# Patient Record
Sex: Male | Born: 1940
Health system: Southern US, Community
[De-identification: ages and names within clinical notes are randomized; demographics above are authoritative.]

## PROBLEM LIST (undated history)

## (undated) DIAGNOSIS — I10 Essential (primary) hypertension: Secondary | ICD-10-CM

## (undated) DIAGNOSIS — E785 Hyperlipidemia, unspecified: Secondary | ICD-10-CM

## (undated) DIAGNOSIS — N400 Enlarged prostate without lower urinary tract symptoms: Secondary | ICD-10-CM

## (undated) HISTORY — DX: Hyperlipidemia, unspecified: E78.5

## (undated) HISTORY — DX: Benign prostatic hyperplasia without lower urinary tract symptoms: N40.0

## (undated) HISTORY — DX: Essential (primary) hypertension: I10

---

## 2001-08-05 ENCOUNTER — Encounter: Admission: RE | Admit: 2001-08-05 | Discharge: 2001-08-05 | Payer: Self-pay | Admitting: *Deleted

## 2011-09-26 ENCOUNTER — Observation Stay (HOSPITAL_COMMUNITY)
Admission: EM | Admit: 2011-09-26 | Discharge: 2011-09-27 | Disposition: A | Discharge status: Home / Self Care | Payer: Medicare HMO | Attending: Cardiology | Admitting: Cardiology

## 2011-09-26 ENCOUNTER — Emergency Department (HOSPITAL_COMMUNITY): Payer: Medicare HMO

## 2011-09-26 DIAGNOSIS — N401 Enlarged prostate with lower urinary tract symptoms: Secondary | ICD-10-CM | POA: Insufficient documentation

## 2011-09-26 DIAGNOSIS — I10 Essential (primary) hypertension: Secondary | ICD-10-CM

## 2011-09-26 DIAGNOSIS — N138 Other obstructive and reflux uropathy: Secondary | ICD-10-CM | POA: Insufficient documentation

## 2011-09-26 DIAGNOSIS — Z9119 Patient's noncompliance with other medical treatment and regimen: Secondary | ICD-10-CM | POA: Insufficient documentation

## 2011-09-26 DIAGNOSIS — R3129 Other microscopic hematuria: Secondary | ICD-10-CM | POA: Insufficient documentation

## 2011-09-26 DIAGNOSIS — I517 Cardiomegaly: Secondary | ICD-10-CM | POA: Insufficient documentation

## 2011-09-26 DIAGNOSIS — E7800 Pure hypercholesterolemia, unspecified: Secondary | ICD-10-CM | POA: Insufficient documentation

## 2011-09-26 DIAGNOSIS — R079 Chest pain, unspecified: Secondary | ICD-10-CM | POA: Insufficient documentation

## 2011-09-26 DIAGNOSIS — Z91199 Patient's noncompliance with other medical treatment and regimen due to unspecified reason: Secondary | ICD-10-CM | POA: Insufficient documentation

## 2011-09-26 LAB — BASIC METABOLIC PANEL
BUN: 14 mg/dL (ref 6–23)
CO2: 24 mEq/L (ref 19–32)
Calcium: 9.2 mg/dL (ref 8.4–10.5)
Chloride: 102 mEq/L (ref 96–112)
Creatinine, Ser: 1.01 mg/dL (ref 0.50–1.35)
GFR calc Af Amer: 86 mL/min — ABNORMAL LOW (ref >90)
GFR calc non Af Amer: 74 mL/min — ABNORMAL LOW (ref >90)
Glucose, Bld: 107 mg/dL — ABNORMAL HIGH (ref 70–99)
Potassium: 3.4 mEq/L — ABNORMAL LOW (ref 3.5–5.1)
Sodium: 138 mEq/L (ref 135–145)

## 2011-09-26 LAB — POCT I-STAT TROPONIN I: Troponin i, poc: 0.01 ng/mL (ref 0.00–0.08)

## 2011-09-26 LAB — URINE MICROSCOPIC-ADD ON

## 2011-09-26 LAB — CBC
HCT: 44 % (ref 39.0–52.0)
Hemoglobin: 15.4 g/dL (ref 13.0–17.0)
MCH: 31.9 pg (ref 26.0–34.0)
MCHC: 35 g/dL (ref 30.0–36.0)
MCV: 91.1 fL (ref 78.0–100.0)
Platelets: 193 10*3/uL (ref 150–400)
RBC: 4.83 MIL/uL (ref 4.22–5.81)
RDW: 12.3 % (ref 11.5–15.5)
WBC: 9.8 10*3/uL (ref 4.0–10.5)

## 2011-09-26 LAB — URINALYSIS, ROUTINE W REFLEX MICROSCOPIC
Glucose, UA: NEGATIVE mg/dL
Ketones, ur: NEGATIVE mg/dL
Leukocytes, UA: NEGATIVE
Nitrite: NEGATIVE
Protein, ur: 100 mg/dL — AB
pH: 7 (ref 5.0–8.0)

## 2011-09-26 LAB — COMPREHENSIVE METABOLIC PANEL
ALT: 20 U/L (ref 0–53)
AST: 20 U/L (ref 0–37)
CO2: 26 mEq/L (ref 19–32)
Calcium: 9.3 mg/dL (ref 8.4–10.5)
Chloride: 101 mEq/L (ref 96–112)
GFR calc Af Amer: 80 mL/min — ABNORMAL LOW (ref >90)
GFR calc non Af Amer: 69 mL/min — ABNORMAL LOW (ref >90)
Glucose, Bld: 171 mg/dL — ABNORMAL HIGH (ref 70–99)
Sodium: 138 mEq/L (ref 135–145)
Total Bilirubin: 0.6 mg/dL (ref 0.3–1.2)

## 2011-09-26 LAB — LIPID PANEL
HDL: 54 mg/dL (ref >39)
LDL Cholesterol: 162 mg/dL — ABNORMAL HIGH (ref 0–99)

## 2011-09-26 LAB — DIFFERENTIAL
Basophils Absolute: 0 10*3/uL (ref 0.0–0.1)
Basophils Relative: 0 % (ref 0–1)
Eosinophils Absolute: 0 10*3/uL (ref 0.0–0.7)
Eosinophils Relative: 0 % (ref 0–5)
Lymphocytes Relative: 17 % (ref 12–46)
Lymphs Abs: 1.6 10*3/uL (ref 0.7–4.0)
Monocytes Absolute: 0.5 10*3/uL (ref 0.1–1.0)
Monocytes Relative: 5 % (ref 3–12)
Neutro Abs: 7.6 10*3/uL (ref 1.7–7.7)
Neutrophils Relative %: 78 % — ABNORMAL HIGH (ref 43–77)

## 2011-09-26 LAB — CARDIAC PANEL(CRET KIN+CKTOT+MB+TROPI)
Relative Index: 3 — ABNORMAL HIGH (ref 0.0–2.5)
Total CK: 230 U/L (ref 7–232)

## 2011-09-27 DIAGNOSIS — I10 Essential (primary) hypertension: Secondary | ICD-10-CM

## 2011-09-27 LAB — CARDIAC PANEL(CRET KIN+CKTOT+MB+TROPI)
CK, MB: 5.6 ng/mL — ABNORMAL HIGH (ref 0.3–4.0)
Total CK: 212 U/L (ref 7–232)
Troponin I: <0.3 ng/mL (ref <0.30)

## 2011-09-27 LAB — HEMOGLOBIN A1C
Hgb A1c MFr Bld: 5.5 % (ref <5.7)
Mean Plasma Glucose: 111 mg/dL (ref <117)

## 2011-09-27 NOTE — H&P (Signed)
Marc [name], Marc [name] NO.:  [id number]  MEDICAL RECORD NO.:  [id number]  LOCATION:  2013                         FACILITY:  [facility]  PHYSICIAN:  Cassell Clement, M.D. DATE OF BIRTH:  08-28-1941  DATE OF ADMISSION:  09/26/2011 DATE OF DISCHARGE:                             HISTORY & PHYSICAL   CARDIOLOGIST:  None.  PRIMARY MD:  [facility].  CHIEF COMPLAINT:  Uncontrolled hypertension.  HISTORY:  This is a 70 year old gentleman who presents to the emergency room with uncontrolled hypertension.  Blood pressures have been in the range of 236/120.  The patient has a past history of essential hypertension.  He had previously been on lisinopril, but had stopped taking his lisinopril/ HCT several months ago for unknown reasons.  The patient complained to his family this morning of being tired and dizzy. He also had a slight chest discomfort, grade 2/10.  He denied any shortness of breath or palpitations, nausea, sweating, or headache.  He has a past history of hypercholesterolemia.  There is a prescription in his old records for Pravachol, but it is unclear whether the patient ever took it.  The patient has been having some difficulty with urinary retention.  He has difficulty urinating.  The patient has had no prior cardiac workup.  He has had no cardiac catheterizations.  It is unknown whether he has ever had an echocardiogram.  He is on no medications at the present time.  He has no known drug allergies.  SOCIAL HISTORY:  He lives in [place] with his wife, son, and other family members.  FAMILY HISTORY:  Reveals that both parents are deceased of unknown cause.  There is no cardiac history in the family.  The patient does not smoke cigarettes.  He drinks an occasional beer.  REVIEW OF SYSTEMS:  Negative for any fever or chills.  He has had dizziness.  He has had no rash.  He denies headache.  He has had weakness.  He has had no  gastrointestinal problems.  He has had some difficulty with urinary emptying of his bladder.  All other systems reviewed and are negative.  PHYSICAL EXAMINATION:  VITAL SIGNS:  Blood pressure is as high as 236/120, now down to 176/90, after IV hydralazine, pulse is 93 sinus rhythm, respirations are 15, O2 sat 99%, temperature 97.6. GENERAL:  This is a well-developed, elderly gentleman, sitting comfortably in bed. HEAD AND NECK:  Unremarkable.  He does have arcus senilis.  The jugular venous pressure is normal. CHEST:  Clear to percussion and auscultation.  HEART:  Reveals a soft apical systolic murmur.  There is no gallop or rub. ABDOMEN:  Soft and nontender.  There are no bruits audible. EXTREMITIES:  Showed no phlebitis or edema.  NEUROLOGIC:  Intact. Integument is normal.  Chest x-ray shows mild cardiomegaly with low lung volumes without acute disease.  There are some small radiopaque metal objects projecting over the right side of the chest, which appear to be subcutaneous.  EKG shows normal sinus rhythm, mild nonspecific T-wave flattening laterally, but no acute changes.  LABORATORY STUDIES:  His initial troponin is normal.  The white count is 9800, hemoglobin 15.4,  hematocrit 44, sodium 138, potassium 3.4, BUN 14, creatinine 1.01.  IMPRESSION: 1. Uncontrolled hypertension secondary to noncompliance with     medication and no blood pressure medicines for the past several     months. 2. Mild chest discomfort graded 2/10, and also mild dizziness     secondary to hypertension. 3. Mild cardiomegaly. 4. Past history of elevated cholesterol. 5. Disposition.  We are admitting under observation status for blood     pressure control and to rule out an myocardial infarction.  I doubt     an myocardial infarction.  We will resume his lisinopril/HCT.  We     will add a low-dose beta-blocker.  We will get serial enzymes and     repeat his EKG in the morning.  We will use repeated  doses of     hydralazine intravenously tonight if necessary for marked     hypertension.          ______________________________ Cassell Clement, M.D.     TB/MEDQ  D:  09/26/2011  T:  09/27/2011  Job:  [id number]  cc:   [facility]  Electronically Signed by Cassell Clement M.D. on 09/27/2011 09:25:01 PM

## 2011-09-28 ENCOUNTER — Other Ambulatory Visit (HOSPITAL_COMMUNITY): Payer: Self-pay | Admitting: Cardiology

## 2011-09-28 DIAGNOSIS — R011 Cardiac murmur, unspecified: Secondary | ICD-10-CM

## 2011-09-28 LAB — URINE CULTURE

## 2011-09-29 ENCOUNTER — Other Ambulatory Visit: Payer: Self-pay | Admitting: Family Medicine

## 2011-09-29 ENCOUNTER — Ambulatory Visit (HOSPITAL_COMMUNITY)
Admission: RE | Admit: 2011-09-29 | Discharge: 2011-09-29 | Disposition: A | Discharge status: Home / Self Care | Payer: Medicare HMO | Source: Ambulatory Visit | Attending: Family Medicine | Admitting: Family Medicine

## 2011-09-29 DIAGNOSIS — I10 Essential (primary) hypertension: Secondary | ICD-10-CM | POA: Insufficient documentation

## 2011-09-29 DIAGNOSIS — G319 Degenerative disease of nervous system, unspecified: Secondary | ICD-10-CM | POA: Insufficient documentation

## 2011-09-29 DIAGNOSIS — R413 Other amnesia: Secondary | ICD-10-CM | POA: Insufficient documentation

## 2011-09-29 DIAGNOSIS — G939 Disorder of brain, unspecified: Secondary | ICD-10-CM | POA: Insufficient documentation

## 2011-09-29 NOTE — Discharge Summary (Addendum)
NAMEFLOY, Marc [name]                 ACCOUNT NO.:  [id number]  MEDICAL RECORD NO.:  [id number]  LOCATION:  2013                         FACILITY:  [facility]  PHYSICIAN:  Cassell Clement, M.D. DATE OF BIRTH:  1940/12/04  DATE OF ADMISSION:  09/26/2011 DATE OF DISCHARGE:  09/27/2011                              DISCHARGE SUMMARY   DISCHARGE DIAGNOSES: 1. Essential hypertension, uncontrolled. 2. Hypercholesterolemia, initiated on Lipitor this admission. 3. Benign prostatic hypertrophy with microscopic hematuria.  HOSPITAL COURSE:  Marc [name] is a 70 year old gentleman with a history of hypertension, who presented initially to [facility] with complaints of being tired and dizzy.  Blood pressure was found to be markedly elevated.  She was sent over to the ER for further evaluation.  Blood pressure was in the range of 236/120.  The patient previously been on lisinopril, but stopped for unknown reason.  He also has slight chest discomfort grade 2/10.  EKG demonstrated normal sinus rhythm with mild nonspecific T-wave flattening laterally, but no acute changes.  Cardiac enzymes were checked, which did show a mild elevation in his MB up to 6.8, but negative troponins x2.  The patient was re- initiated on a medication regimen that included low-dose beta-blocker, lisinopril, HCTZ with improvement in his blood pressure down to 140 range.  His potassium was also repleted.  Given an abnormal lipid panel including an LDL 162, and statin was initiated as well.  Chest x-ray demonstrated mild cardiomegaly and therefore 2D echocardiogram is favored to be done as an outpatient.  Dr. Patty Sermons was seen and examined him today and feels he is stable for discharge.  Of note, he also was found to have symptoms of BPH with moderate hematuria, and therefore be instructed to follow up back with his primary care provider.  DISCHARGE LABS:  WBC 9.8, hemoglobin 15.4, hematocrit 44, platelet  count 193.  Sodium 138, potassium 3.4, chloride 101, CO2 26, glucose 171, BUN 14, creatinine 1.07.  LFTs were normal.  A1c was 5.5.  CKs were negative.  CPK-MB 6.8.  Troponin negative x2.  Total cholesterol 243. Triglycerides 134, HDL 54, LDL 162.  UA showed moderate blood, with 100 protein, otherwise negative.  STUDIES: 1. Chest x-ray September 26, 2011 showed mild cardiomegaly and low lung     volumes without acute disease.  Small radiopaque metallic objects     project over the right side of the chest favored to be     subcutaneously.  He has known shrapnel from prior war injuries.  DISCHARGE MEDICATIONS: 1. Aspirin 81 mg daily, initiated per discussion with Dr. Patty Sermons. 2. HCTZ 25 mg daily. 3. Lipitor 20 mg at bedtime. 4. Lisinopril 20 mg daily. 5. Lopressor 25 mg half tablet b.i.d. 6. Potassium chloride 20 mEq daily.  DISPOSITION:  Marc [name] will be discharged in stable condition to home. He is to follow up with [facility] regards to his microscopic hematuria and also for continued medical maintenance.  He will have a 2D echocardiogram in our office on November 6 along with a BMET, on that day, he will also see Dr. Norma Fredrickson.  He is to arrive at our  office at 11:30 a.m. that day.  DURATION OF DISCHARGE ENCOUNTER:  Greater than 30 minutes including physician and PA time.     Ronie Spies, P.A.C.   ______________________________ Cassell Clement, M.D.    DD/MEDQ  D:  09/27/2011  T:  09/27/2011  Job:  [id number]  Electronically Signed by Ronie Spies  on 09/29/2011 12:03:31 PM Electronically Signed by Cassell Clement M.D. on 09/29/2011 10:18:22 PM

## 2011-10-01 ENCOUNTER — Encounter: Payer: Self-pay | Admitting: Nurse Practitioner

## 2011-10-02 ENCOUNTER — Ambulatory Visit (HOSPITAL_COMMUNITY): Payer: Medicare HMO | Attending: Cardiology

## 2011-10-02 ENCOUNTER — Ambulatory Visit (INDEPENDENT_AMBULATORY_CARE_PROVIDER_SITE_OTHER): Payer: Medicare HMO | Admitting: Nurse Practitioner

## 2011-10-02 ENCOUNTER — Ambulatory Visit (INDEPENDENT_AMBULATORY_CARE_PROVIDER_SITE_OTHER): Payer: Medicare HMO | Admitting: *Deleted

## 2011-10-02 ENCOUNTER — Encounter: Payer: Self-pay | Admitting: Nurse Practitioner

## 2011-10-02 VITALS — BP 186/102 | HR 70 | Ht 65.0 in | Wt 150.0 lb

## 2011-10-02 DIAGNOSIS — R319 Hematuria, unspecified: Secondary | ICD-10-CM | POA: Insufficient documentation

## 2011-10-02 DIAGNOSIS — I10 Essential (primary) hypertension: Secondary | ICD-10-CM | POA: Insufficient documentation

## 2011-10-02 DIAGNOSIS — I059 Rheumatic mitral valve disease, unspecified: Secondary | ICD-10-CM | POA: Insufficient documentation

## 2011-10-02 DIAGNOSIS — R011 Cardiac murmur, unspecified: Secondary | ICD-10-CM

## 2011-10-02 DIAGNOSIS — E785 Hyperlipidemia, unspecified: Secondary | ICD-10-CM

## 2011-10-02 DIAGNOSIS — I079 Rheumatic tricuspid valve disease, unspecified: Secondary | ICD-10-CM | POA: Insufficient documentation

## 2011-10-02 DIAGNOSIS — I379 Nonrheumatic pulmonary valve disorder, unspecified: Secondary | ICD-10-CM | POA: Insufficient documentation

## 2011-10-02 LAB — BASIC METABOLIC PANEL
CO2: 29 mEq/L (ref 19–32)
Chloride: 102 mEq/L (ref 96–112)
Creatinine, Ser: 1.1 mg/dL (ref 0.4–1.5)
Potassium: 3.8 mEq/L (ref 3.5–5.1)
Sodium: 138 mEq/L (ref 135–145)

## 2011-10-02 MED ORDER — CLONIDINE HCL 0.1 MG PO TABS: 0.1000 mg | ORAL_TABLET | Freq: Every day | ORAL | Status: DC

## 2011-10-02 NOTE — Patient Instructions (Addendum)
Blood pressure is still too high.  Continue with the other medicines. We are adding Clonidine 0.1 mg every day.   We will see you back in one week.   See your primary care doctor if you still have blood in your urine.  We will be calling with the results of your ultrasound in a few days.

## 2011-10-02 NOTE — Progress Notes (Signed)
    Marc [name] Date of Birth: 18-Jun-1941 Medical Record #[id number]  History of Present Illness: [name] is seen back today for a post hospital visit. He is seen for Dr. Patty [name]. He was admitted last week with hypertensive crisis. He was discharged on medicines. He comes in to the office today. He has a family member who says he is a son who interprets. He has no complaint. Denies chest pain or headache. Not dizzy. Blood pressure remains grossly elevated. The family member made a phone call to another son who says he has been taking his medicines.   Current Outpatient Prescriptions on File Prior to Visit  Medication Sig Dispense Refill  . aspirin 81 MG tablet Take 81 mg by mouth daily.        Marland Kitchen atorvastatin (LIPITOR) 20 MG tablet Take 20 mg by mouth daily.        . hydrochlorothiazide (HYDRODIURIL) 25 MG tablet Take 25 mg by mouth daily.        Marland Kitchen lisinopril (PRINIVIL,ZESTRIL) 20 MG tablet Take 20 mg by mouth daily.        . metoprolol tartrate (LOPRESSOR) 25 MG tablet Take 12.5 mg by mouth 2 (two) times daily.        . potassium chloride SA (K-DUR,KLOR-CON) 20 MEQ tablet Take 20 mEq by mouth daily.          No Known Allergies  Past Medical History  Diagnosis Date  . Hypertension   . Hyperlipidemia   . BPH (benign prostatic hypertrophy)     WITH MICROSCOPIC HEMATURIA    No past surgical history on file.  History  Smoking status  . Never Smoker   Smokeless tobacco  . Not on file    History  Alcohol Use  . Yes    OCCASSIONAL    No family history on file.  Review of Systems: The review of systems is per the HPI.  All other systems were reviewed and are negative.  Physical Exam: BP 186/102  Pulse 70  Ht 5\' 5"  (1.651 m)  Wt 150 lb (68.04 kg)  BMI 24.96 kg/m2 Repeat blood pressure is 200/100 by me.  Patient is in no acute distress. Does not speak [place]. Skin is warm and dry. Color is normal.  HEENT is unremarkable. Normocephalic/atraumatic. PERRL. Sclera are  nonicteric. Neck is supple. No masses. No JVD. Lungs are clear. Cardiac exam shows a regular rate and rhythm. No S4 noted. Abdomen is soft. Extremities are without edema. Gait and ROM are intact. No gross neurologic deficits noted.  LABORATORY DATA:   Assessment / Plan:

## 2011-10-02 NOTE — Assessment & Plan Note (Signed)
He is on statin therapy. Will need follow up labs in the future.

## 2011-10-02 NOTE — Assessment & Plan Note (Signed)
He was to follow up with his PCP.

## 2011-10-02 NOTE — Assessment & Plan Note (Addendum)
Blood pressure remains grossly elevated. I am not convinced of medication compliance. I have given him 0.1mg  of Clonidine po here in the office after talking with Dr. Patty Sermons. Will need to get a BMET today as well. Echo has been done earlier today. Those results are pending. We will tentatively see him back in 1 week. Patient is agreeable to this plan and will call if any problems develop in the interim.   Blood pressure has come down here in the office after a dose of Clonidine. He is down to 160/90. I will see him again in one week. We will continue the clonidine at 0.1mg  daily. Prescription was eprescribed.

## 2011-10-05 ENCOUNTER — Telehealth: Payer: Self-pay | Admitting: *Deleted

## 2011-10-05 NOTE — Telephone Encounter (Signed)
Advised son of medication change

## 2011-10-05 NOTE — Telephone Encounter (Signed)
Message copied by Burnell Blanks on Fri Oct 05, 2011  5:25 PM ------      Message from: Cassell Clement      Created: Tue Oct 02, 2011  9:05 PM       Please report.  Echo shows severe LVH with dynamic obstruction. Increase Metoprolol to 25 mg BID

## 2011-10-05 NOTE — Telephone Encounter (Signed)
Message copied by Burnell Blanks on Fri Oct 05, 2011  5:24 PM ------      Message from: Cassell Clement      Created: Tue Oct 02, 2011  4:42 PM       Please report.  The labs are stable.  Continue same meds.  Continue careful diet.

## 2011-10-05 NOTE — Telephone Encounter (Signed)
Advised son

## 2011-10-09 ENCOUNTER — Ambulatory Visit (INDEPENDENT_AMBULATORY_CARE_PROVIDER_SITE_OTHER): Payer: Medicare HMO | Admitting: Nurse Practitioner

## 2011-10-09 ENCOUNTER — Encounter: Payer: Self-pay | Admitting: Nurse Practitioner

## 2011-10-09 VITALS — BP 140/70 | HR 66 | Ht 63.0 in | Wt 150.0 lb

## 2011-10-09 DIAGNOSIS — R319 Hematuria, unspecified: Secondary | ICD-10-CM

## 2011-10-09 DIAGNOSIS — I10 Essential (primary) hypertension: Secondary | ICD-10-CM

## 2011-10-09 NOTE — Assessment & Plan Note (Signed)
Blood pressure recheck by me is better. He has good control at home reported. We will leave him on his current regimen. We will see him back in 3 months. His son will continue to monitor at home. Patient is agreeable to this plan and will call if any problems develop in the interim.

## 2011-10-09 NOTE — Assessment & Plan Note (Signed)
He will be seeing [facility] later this month.

## 2011-10-09 NOTE — Patient Instructions (Signed)
Continue with your current medicines. Monitor your blood pressure at home. Try to write some of the readings down.  Record your readings and bring to your next visit. Limit sodium intake.   We will see you back in about 3 months.  Call the [facility] office at [phone] if you have any questions, problems or concerns.

## 2011-10-09 NOTE — Progress Notes (Signed)
    Marc [name] Date of Birth: [birth year] Medical Record #[id number]  History of Present Illness: Marc [name] is seen back today for a one week check. He is seen for Dr. Patty Sermons. He is here with his son today who is the interpreter. He also manages his father's medicines.  His echo showed severe LVH. Metoprolol has been increased. His son has been checking his blood pressure at home. Readings are down to the 130 to 135 range at home. He is tolerating his medicines. He will be seeing GU later this month for his prostate and hematuria. Son reports some confusion but that is improving with time as well. No chest pain or shortness of breath. No dizziness or lightheadedness.   Current Outpatient Prescriptions on File Prior to Visit  Medication Sig Dispense Refill  . aspirin 81 MG tablet Take 81 mg by mouth daily.        Marland Kitchen atorvastatin (LIPITOR) 20 MG tablet Take 20 mg by mouth daily.        . cloNIDine (CATAPRES) 0.1 MG tablet Take 1 tablet (0.1 mg total) by mouth daily.  30 tablet  11  . hydrochlorothiazide (HYDRODIURIL) 25 MG tablet Take 25 mg by mouth daily.        Marland Kitchen lisinopril (PRINIVIL,ZESTRIL) 20 MG tablet Take 20 mg by mouth daily.        . metoprolol tartrate (LOPRESSOR) 25 MG tablet Take 25 mg by mouth 2 (two) times daily.       . potassium chloride SA (K-DUR,KLOR-CON) 20 MEQ tablet Take 20 mEq by mouth daily.          No Known Allergies  Past Medical History  Diagnosis Date  . Hypertension     has severe LVH per echo in November 2012  . Hyperlipidemia   . BPH (benign prostatic hypertrophy)     WITH MICROSCOPIC HEMATURIA  . Hematuria     History reviewed. No pertinent past surgical history.  History  Smoking status  . Never Smoker   Smokeless tobacco  . Not on file    History  Alcohol Use  . Yes    OCCASSIONAL    History reviewed. No pertinent family history.  Review of Systems: The review of systems is per the HPI.  All other systems were reviewed and are  negative.  Physical Exam: BP 140/70  Pulse 66  Ht 5\' 3"  (1.6 m)  Wt 150 lb (68.04 kg)  BMI 26.57 kg/m2 Patient is very pleasant and in no acute distress. Skin is warm and dry. Color is normal.  HEENT is unremarkable. Normocephalic/atraumatic. PERRL. Sclera are nonicteric. Neck is supple. No masses. No JVD. Lungs are clear. Cardiac exam shows a regular rate and rhythm. Abdomen is soft. Extremities are without edema. Gait and ROM are intact. No gross neurologic deficits noted.  LABORATORY DATA:   Assessment / Plan:

## 2011-10-24 ENCOUNTER — Other Ambulatory Visit: Payer: Self-pay | Admitting: Nurse Practitioner

## 2011-10-24 NOTE — Telephone Encounter (Signed)
New Msg: Pt calling stating that he went to pick, up RX for metoprolol and pharmacy said it was too early. Pt said RX stated that pt should take one and one half tablet per day however pt was told to take two tablets per day so pt now only has seven tablets left. Please return pt call to discuss further/clarify.

## 2011-10-25 MED ORDER — METOPROLOL TARTRATE 25 MG PO TABS: 25.0000 mg | ORAL_TABLET | Freq: Two times a day (BID) | ORAL | Status: DC

## 2011-10-25 NOTE — Telephone Encounter (Signed)
Pt's son aware RX for one tablet twice a day #60 with 6 refills called sent into pharmacy.  He had no further needs.

## 2011-10-25 NOTE — Telephone Encounter (Signed)
Pt is to take one tablet twice a day

## 2011-11-22 ENCOUNTER — Other Ambulatory Visit: Payer: Self-pay | Admitting: Internal Medicine

## 2012-01-14 ENCOUNTER — Ambulatory Visit (INDEPENDENT_AMBULATORY_CARE_PROVIDER_SITE_OTHER): Payer: Medicare Other | Admitting: Cardiology

## 2012-01-14 ENCOUNTER — Encounter: Payer: Self-pay | Admitting: Cardiology

## 2012-01-14 VITALS — BP 128/78 | HR 80 | Ht 65.0 in | Wt 161.0 lb

## 2012-01-14 DIAGNOSIS — E7800 Pure hypercholesterolemia, unspecified: Secondary | ICD-10-CM

## 2012-01-14 DIAGNOSIS — R319 Hematuria, unspecified: Secondary | ICD-10-CM

## 2012-01-14 DIAGNOSIS — I119 Hypertensive heart disease without heart failure: Secondary | ICD-10-CM

## 2012-01-14 DIAGNOSIS — I10 Essential (primary) hypertension: Secondary | ICD-10-CM

## 2012-01-14 NOTE — Assessment & Plan Note (Signed)
Blood pressure has been remaining normal at home on current therapy.

## 2012-01-14 NOTE — Assessment & Plan Note (Signed)
The patient has had no further hematuria.  He remains on Rapaflo with good results

## 2012-01-14 NOTE — Progress Notes (Signed)
Marc [name] Date of Birth:  08/18/1941 [facility] [address] [place], [place]  [address] [phone]  Fax   [phone]  HPI:  this pleasant 71 year old gentleman is seen for a three-month followup office visit.  He has a past history of severe hypertension with concentric LVH.  He has regarding multiple medication to bring his blood pressure down to normal levels.  He is feeling well and is having no symptoms of chest pain or shortness of breath.  He is not having any side effects from the medication.  His son sees 2 at that the patient takes his medicine.  Current Outpatient Prescriptions  Medication Sig Dispense Refill  . aspirin 81 MG tablet Take 81 mg by mouth daily.        Marland Kitchen atorvastatin (LIPITOR) 20 MG tablet TAKE 1 TABLET BY MOUTH EVERY NIGHT AT BEDTIME  90 tablet  3  . cloNIDine (CATAPRES) 0.1 MG tablet Take 1 tablet (0.1 mg total) by mouth daily.  30 tablet  11  . hydrochlorothiazide (HYDRODIURIL) 25 MG tablet TAKE 1 TABLET BY MOUTH EVERY DAY  90 tablet  3  . lisinopril (PRINIVIL,ZESTRIL) 20 MG tablet TAKE 1 TABLET BY MOUTH EVERY DAY  90 tablet  3  . metoprolol tartrate (LOPRESSOR) 25 MG tablet Take 1 tablet (25 mg total) by mouth 2 (two) times daily.  60 tablet  6  . potassium chloride SA (K-DUR,KLOR-CON) 20 MEQ tablet TAKE 1 TABLET BY MOUTH EVERY DAY  90 tablet  3  . RAPAFLO 8 MG CAPS capsule 8 mg daily.         No Known Allergies  Patient Active Problem List  Diagnoses  . HTN (hypertension)  . Hyperlipidemia  . Hematuria    History  Smoking status  . Never Smoker   Smokeless tobacco  . Not on file    History  Alcohol Use  . Yes    OCCASSIONAL    No family history on file.  Review of Systems: The patient denies any heat or cold intolerance.  No weight gain or weight loss.  The patient denies headaches or blurry vision.  There is no cough or sputum production.  The patient denies dizziness.  There is no hematuria or hematochezia.   The patient denies any muscle aches or arthritis.  The patient denies any rash.  The patient denies frequent falling or instability.  There is no history of depression or anxiety.  All other systems were reviewed and are negative.   Physical Exam: Filed Vitals:   01/14/12 1600  BP: 128/78  Pulse: 80   the general appearance reveals a well-developed well-nourished elderly gentleman in no distress.The head and neck exam reveals pupils equal and reactive.  Extraocular movements are full.  There is no scleral icterus.  The mouth and pharynx are normal.  The neck is supple.  The carotids reveal no bruits.  The jugular venous pressure is normal.  The  thyroid is not enlarged.  There is no lymphadenopathy.  The chest is clear to percussion and auscultation.  There are no rales or rhonchi.  Expansion of the chest is symmetrical.  The precordium is quiet.  The first heart sound is normal.  The second heart sound is physiologically split.  There is no murmur gallop rub or click.  There is no abnormal lift or heave.  The abdomen is soft and nontender.  The bowel sounds are normal.  The liver and spleen are not enlarged.  There are  no abdominal masses.  There are no abdominal bruits.  Extremities reveal good pedal pulses.  There is no phlebitis or edema.  There is no cyanosis or clubbing.  Strength is normal and symmetrical in all extremities.  There is no lateralizing weakness.  There are no sensory deficits.  The skin is warm and dry.  There is no rash.      Assessment / Plan: Continue same medication.  Recheck in 3 months for followup office visit lipid panel hepatic function panel and basal metabolic panel.

## 2012-01-14 NOTE — Patient Instructions (Signed)
Your physician recommends that you continue on your current medications as directed. Please refer to the Current Medication list given to you today  Your physician recommends that you schedule a follow-up appointment in: 3 months with fasting labs (LP/BMET/HFP)  

## 2012-07-15 ENCOUNTER — Other Ambulatory Visit: Payer: Self-pay | Admitting: Cardiology

## 2012-07-15 MED ORDER — METOPROLOL TARTRATE 25 MG PO TABS: 25.0000 mg | ORAL_TABLET | Freq: Two times a day (BID) | ORAL | Status: DC

## 2012-08-28 ENCOUNTER — Ambulatory Visit: Payer: Medicare Other | Admitting: Cardiology

## 2012-08-28 ENCOUNTER — Encounter: Payer: Self-pay | Admitting: Cardiology

## 2012-08-28 ENCOUNTER — Other Ambulatory Visit (INDEPENDENT_AMBULATORY_CARE_PROVIDER_SITE_OTHER): Payer: Medicare Other

## 2012-08-28 ENCOUNTER — Ambulatory Visit (INDEPENDENT_AMBULATORY_CARE_PROVIDER_SITE_OTHER): Payer: Medicare Other | Admitting: Cardiology

## 2012-08-28 ENCOUNTER — Other Ambulatory Visit: Payer: Medicare Other

## 2012-08-28 VITALS — BP 136/82 | HR 65 | Ht 65.0 in | Wt 165.0 lb

## 2012-08-28 DIAGNOSIS — E7800 Pure hypercholesterolemia, unspecified: Secondary | ICD-10-CM

## 2012-08-28 DIAGNOSIS — E785 Hyperlipidemia, unspecified: Secondary | ICD-10-CM

## 2012-08-28 DIAGNOSIS — I119 Hypertensive heart disease without heart failure: Secondary | ICD-10-CM

## 2012-08-28 DIAGNOSIS — R0989 Other specified symptoms and signs involving the circulatory and respiratory systems: Secondary | ICD-10-CM

## 2012-08-28 DIAGNOSIS — I10 Essential (primary) hypertension: Secondary | ICD-10-CM

## 2012-08-28 LAB — LIPID PANEL
Cholesterol: 158 mg/dL (ref 0–200)
LDL Cholesterol: 73 mg/dL (ref 0–99)
Total CHOL/HDL Ratio: 3

## 2012-08-28 LAB — HEPATIC FUNCTION PANEL
Alkaline Phosphatase: 81 U/L (ref 39–117)
Bilirubin, Direct: 0.2 mg/dL (ref 0.0–0.3)
Total Bilirubin: 1.1 mg/dL (ref 0.3–1.2)

## 2012-08-28 LAB — BASIC METABOLIC PANEL
Calcium: 9.1 mg/dL (ref 8.4–10.5)
Chloride: 104 mEq/L (ref 96–112)
Creatinine, Ser: 1.4 mg/dL (ref 0.4–1.5)
Potassium: 4.4 mEq/L (ref 3.5–5.1)
Sodium: 137 mEq/L (ref 135–145)

## 2012-08-28 NOTE — Assessment & Plan Note (Signed)
Patient has a history of hypercholesterolemia and is on Lipitor 20 mg daily.  He is not having any myalgias from Lipitor.  We are checking blood work today

## 2012-08-28 NOTE — Assessment & Plan Note (Signed)
The patient denies any chest pain or shortness of breath.  He is having no dizziness or syncope.

## 2012-08-28 NOTE — Assessment & Plan Note (Signed)
The patient has had no further episodes of gross hematuria.

## 2012-08-28 NOTE — Progress Notes (Signed)
Marc [name] Date of Birth:  [birth year] [facility] [address] [place], [place]  [address] [phone]  Fax   [phone]  HPI: this pleasant 71 year old gentleman is seen for a three-month followup office visit. He has a past history of severe hypertension with concentric LVH. He has regarding multiple medication to bring his blood pressure down to normal levels. He is feeling well and is having no symptoms of chest pain or shortness of breath. He is not having any side effects from the medication. His son sees to it that the patient takes his medicine.  The patient also has a history of bladder problems and has been on rapaflo and reports good improvement in symptoms although he still has nocturia.   Current Outpatient Prescriptions  Medication Sig Dispense Refill  . aspirin 81 MG tablet Take 81 mg by mouth daily.        Marland Kitchen atorvastatin (LIPITOR) 20 MG tablet TAKE 1 TABLET BY MOUTH EVERY NIGHT AT BEDTIME  90 tablet  3  . cloNIDine (CATAPRES) 0.1 MG tablet Take 1 tablet (0.1 mg total) by mouth daily.  30 tablet  11  . hydrochlorothiazide (HYDRODIURIL) 25 MG tablet TAKE 1 TABLET BY MOUTH EVERY DAY  90 tablet  3  . lisinopril (PRINIVIL,ZESTRIL) 20 MG tablet TAKE 1 TABLET BY MOUTH EVERY DAY  90 tablet  3  . metoprolol tartrate (LOPRESSOR) 25 MG tablet Take 1 tablet (25 mg total) by mouth 2 (two) times daily.  60 tablet  9  . potassium chloride SA (K-DUR,KLOR-CON) 20 MEQ tablet TAKE 1 TABLET BY MOUTH EVERY DAY  90 tablet  3  . RAPAFLO 8 MG CAPS capsule 8 mg daily.         No Known Allergies  Patient Active Problem List  Diagnosis  . HTN (hypertension)  . Hyperlipidemia  . Hematuria    History  Smoking status  . Never Smoker   Smokeless tobacco  . Not on file    History  Alcohol Use  . Yes    OCCASSIONAL    No family history on file.  Review of Systems: The patient denies any heat or cold intolerance.  No weight gain or weight loss.  The patient  denies headaches or blurry vision.  There is no cough or sputum production.  The patient denies dizziness.  There is no hematuria or hematochezia.  The patient denies any muscle aches or arthritis.  The patient denies any rash.  The patient denies frequent falling or instability.  There is no history of depression or anxiety.  All other systems were reviewed and are negative.   Physical Exam: Filed Vitals:   08/28/12 1048  BP: 136/82  Pulse: 65   the general appearance reveals a well-developed well-nourished non-English speaking gentleman in no distress.  His son or grandson is here to interpret for him.The head and neck exam reveals pupils equal and reactive.  Extraocular movements are full.  There is no scleral icterus.  The mouth and pharynx are normal.  The neck is supple.  The carotids reveal no bruits.  The jugular venous pressure is normal.  The  thyroid is not enlarged.  There is no lymphadenopathy.  The chest is clear to percussion and auscultation.  There are no rales or rhonchi.  Expansion of the chest is symmetrical.  The precordium is quiet.  The first heart sound is normal.  The second heart sound is physiologically split.  There is no murmur gallop rub or  click.  There is no abnormal lift or heave.  The abdomen is soft and nontender.  The bowel sounds are normal.  The liver and spleen are not enlarged.  There are no abdominal masses.  There are no abdominal bruits.  Extremities reveal good pedal pulses.  There is no phlebitis or edema.  There is no cyanosis or clubbing.  Strength is normal and symmetrical in all extremities.  There is no lateralizing weakness.  There are no sensory deficits.  The skin is warm and dry.  There is no rash.  EKG today shows normal sinus rhythm and is within normal limits.  There is no evidence of LVH or strain on EKG.    Assessment / Plan: Continue same medication.  Blood work pending.  Recheck in 6 months for office visit and fasting lab work.

## 2012-08-28 NOTE — Patient Instructions (Addendum)
Lab work today    Your physician wants you to follow-up in: 6 months with fasting lab work. You will receive a reminder letter in the mail two months in advance. If you don't receive a letter, please call our office to schedule the follow-up appointment.     Continue same medications.

## 2012-08-28 NOTE — Progress Notes (Signed)
Quick Note:  Please report to patient. The recent labs are stable. Continue same medication and careful diet. ______ 

## 2012-09-16 ENCOUNTER — Other Ambulatory Visit: Payer: Self-pay | Admitting: Nurse Practitioner

## 2012-10-13 ENCOUNTER — Other Ambulatory Visit: Payer: Medicare Other

## 2012-10-13 ENCOUNTER — Ambulatory Visit: Payer: Medicare Other | Admitting: Cardiology

## 2012-10-27 ENCOUNTER — Other Ambulatory Visit: Payer: Self-pay | Admitting: Cardiology

## 2012-10-27 MED ORDER — CLONIDINE HCL 0.1 MG PO TABS: 0.1000 mg | ORAL_TABLET | Freq: Every day | ORAL | Status: DC

## 2012-11-27 ENCOUNTER — Other Ambulatory Visit: Payer: Self-pay | Admitting: *Deleted

## 2012-11-27 MED ORDER — POTASSIUM CHLORIDE CRYS ER 20 MEQ PO TBCR: 20.0000 meq | EXTENDED_RELEASE_TABLET | Freq: Every day | ORAL | Status: DC

## 2013-01-13 ENCOUNTER — Telehealth: Payer: Self-pay | Admitting: Cardiology

## 2013-01-13 MED ORDER — METOPROLOL TARTRATE 25 MG PO TABS: 25.0000 mg | ORAL_TABLET | Freq: Two times a day (BID) | ORAL | Status: DC

## 2013-01-13 MED ORDER — ATORVASTATIN CALCIUM 20 MG PO TABS: 20.0000 mg | ORAL_TABLET | Freq: Every day | ORAL | Status: DC

## 2013-01-13 MED ORDER — POTASSIUM CHLORIDE CRYS ER 20 MEQ PO TBCR: 20.0000 meq | EXTENDED_RELEASE_TABLET | Freq: Every day | ORAL | Status: DC

## 2013-01-13 MED ORDER — CLONIDINE HCL 0.1 MG PO TABS: 0.1000 mg | ORAL_TABLET | Freq: Every day | ORAL | Status: DC

## 2013-01-13 MED ORDER — HYDROCHLOROTHIAZIDE 25 MG PO TABS: 25.0000 mg | ORAL_TABLET | Freq: Every day | ORAL | Status: DC

## 2013-01-13 MED ORDER — LISINOPRIL 20 MG PO TABS: 20.0000 mg | ORAL_TABLET | Freq: Every day | ORAL | Status: DC

## 2013-01-13 NOTE — Telephone Encounter (Signed)
New problem    Refill on all medication     [facility] on [address].

## 2013-01-13 NOTE — Telephone Encounter (Signed)
Last ov reviewed, refills completed all for a year except atorvastatin till yearly labs completed.

## 2013-01-14 ENCOUNTER — Other Ambulatory Visit: Payer: Self-pay | Admitting: *Deleted

## 2013-01-14 MED ORDER — HYDROCHLOROTHIAZIDE 25 MG PO TABS: 25.0000 mg | ORAL_TABLET | Freq: Every day | ORAL | Status: DC

## 2013-01-14 MED ORDER — LISINOPRIL 20 MG PO TABS: 20.0000 mg | ORAL_TABLET | Freq: Every day | ORAL | Status: DC

## 2013-01-14 MED ORDER — ATORVASTATIN CALCIUM 20 MG PO TABS: 20.0000 mg | ORAL_TABLET | Freq: Every day | ORAL | Status: DC

## 2013-02-24 ENCOUNTER — Telehealth: Payer: Self-pay | Admitting: *Deleted

## 2013-02-24 ENCOUNTER — Ambulatory Visit (INDEPENDENT_AMBULATORY_CARE_PROVIDER_SITE_OTHER): Payer: Medicare Other | Admitting: Cardiology

## 2013-02-24 ENCOUNTER — Encounter: Payer: Self-pay | Admitting: Cardiology

## 2013-02-24 ENCOUNTER — Other Ambulatory Visit (INDEPENDENT_AMBULATORY_CARE_PROVIDER_SITE_OTHER): Payer: Medicare Other

## 2013-02-24 VITALS — BP 138/72 | HR 66 | Ht 67.0 in | Wt 165.8 lb

## 2013-02-24 DIAGNOSIS — E785 Hyperlipidemia, unspecified: Secondary | ICD-10-CM

## 2013-02-24 DIAGNOSIS — R0989 Other specified symptoms and signs involving the circulatory and respiratory systems: Secondary | ICD-10-CM

## 2013-02-24 DIAGNOSIS — R413 Other amnesia: Secondary | ICD-10-CM | POA: Insufficient documentation

## 2013-02-24 DIAGNOSIS — I10 Essential (primary) hypertension: Secondary | ICD-10-CM

## 2013-02-24 DIAGNOSIS — I119 Hypertensive heart disease without heart failure: Secondary | ICD-10-CM

## 2013-02-24 LAB — LIPID PANEL
Cholesterol: 145 mg/dL (ref 0–200)
Triglycerides: 191 mg/dL — ABNORMAL HIGH (ref 0.0–149.0)

## 2013-02-24 LAB — BASIC METABOLIC PANEL
BUN: 26 mg/dL — ABNORMAL HIGH (ref 6–23)
Calcium: 9.4 mg/dL (ref 8.4–10.5)
Creatinine, Ser: 1.5 mg/dL (ref 0.4–1.5)
GFR: 47.87 mL/min — ABNORMAL LOW (ref >60.00)

## 2013-02-24 LAB — HEPATIC FUNCTION PANEL
ALT: 50 U/L (ref 0–53)
Albumin: 4.2 g/dL (ref 3.5–5.2)
Alkaline Phosphatase: 75 U/L (ref 39–117)
Bilirubin, Direct: 0 mg/dL (ref 0.0–0.3)
Total Protein: 7.8 g/dL (ref 6.0–8.3)

## 2013-02-24 MED ORDER — DONEPEZIL HCL 5 MG PO TABS: 5.0000 mg | ORAL_TABLET | Freq: Every day | ORAL | Status: DC

## 2013-02-24 MED ORDER — DONEPEZIL HCL 5 MG PO TABS: 5.0000 mg | ORAL_TABLET | Freq: Every evening | ORAL | Status: DC | PRN

## 2013-02-24 NOTE — Assessment & Plan Note (Signed)
The patient has not been having any chest pain or shortness of breath.  Blood pressure has been staying stable on current therapy

## 2013-02-24 NOTE — Assessment & Plan Note (Signed)
The patient is on Lipitor 20 mg daily for his hypercholesterolemia.  We are checking lab work today

## 2013-02-24 NOTE — Progress Notes (Signed)
Quick Note:  Please report to patient. The recent labs are stable. Continue same medication and careful diet. Except potassium too high so stop potassium. ______

## 2013-02-24 NOTE — Assessment & Plan Note (Signed)
The patient has been having some short-term memory problems and the family wonders if there is something we could give him that might help that.  We will give him a trial of generic Aricept 5 mg at bedtime.

## 2013-02-24 NOTE — Telephone Encounter (Signed)
Advised son (language barrier)

## 2013-02-24 NOTE — Telephone Encounter (Signed)
Message copied by Burnell Blanks on Tue Feb 24, 2013  6:02 PM ------      Message from: Cassell Clement      Created: Tue Feb 24, 2013  5:15 PM       Please report to patient.  The recent labs are stable. Continue same medication and careful diet. Except potassium too high so stop potassium. ------

## 2013-02-24 NOTE — Patient Instructions (Addendum)
Will obtain labs today and call you with the results (lp/bmet/hfp)  WILL ADD ARICEPT 5 MG DAILY AT BEDTIME, RX SENT TO [facility]  Your physician wants you to follow-up in: 6 months with fasting labs (lp/bmet/hfp) and ekg You will receive a reminder letter in the mail two months in advance. If you don't receive a letter, please call our office to schedule the follow-up appointment.

## 2013-02-24 NOTE — Progress Notes (Signed)
Marc [name] Date of Birth:  05-Jan-1941 [name] [address] [place], [place]  [address] [phone]         Fax   [phone]  History of Present Illness: This pleasant 72 year old gentleman is seen for a three-month followup office visit.  He does not speak any [place].  His son translates. He has a past history of severe hypertension with concentric LVH. He has regarding multiple medication to bring his blood pressure down to normal levels. He is feeling well and is having no symptoms of chest pain or shortness of breath. He is not having any side effects from the medication. His son sees to it that the patient takes his medicine. The patient also has a history of bladder problems and has been on rapaflo and reports good improvement in symptoms although he still has nocturia.  His son mentions that the patient has been having some progressive short-term memory problems.   Current Outpatient Prescriptions  Medication Sig Dispense Refill  . aspirin 81 MG tablet Take 81 mg by mouth daily.        Marland Kitchen atorvastatin (LIPITOR) 20 MG tablet Take 1 tablet (20 mg total) by mouth daily.  90 tablet  1  . cloNIDine (CATAPRES) 0.1 MG tablet Take 1 tablet (0.1 mg total) by mouth daily.  90 tablet  3  . hydrochlorothiazide (HYDRODIURIL) 25 MG tablet Take 1 tablet (25 mg total) by mouth daily.  90 tablet  1  . lisinopril (PRINIVIL,ZESTRIL) 20 MG tablet Take 1 tablet (20 mg total) by mouth daily.  90 tablet  1  . metoprolol tartrate (LOPRESSOR) 25 MG tablet Take 1 tablet (25 mg total) by mouth 2 (two) times daily.  180 tablet  3  . potassium chloride SA (K-DUR,KLOR-CON) 20 MEQ tablet Take 1 tablet (20 mEq total) by mouth daily.  90 tablet  3  . RAPAFLO 8 MG CAPS capsule 8 mg daily.       Marland Kitchen donepezil (ARICEPT) 5 MG tablet Take 1 tablet (5 mg total) by mouth at bedtime.  30 tablet  5   No current facility-administered medications for this visit.    No Known Allergies  Patient  Active Problem List  Diagnosis  . HTN (hypertension)  . Hyperlipidemia  . Hematuria    History  Smoking status  . Never Smoker   Smokeless tobacco  . Not on file    History  Alcohol Use  . Yes    Comment: OCCASSIONAL    No family history on file.  Review of Systems: Constitutional: no fever chills diaphoresis or fatigue or change in weight.  Head and neck: no hearing loss, no epistaxis, no photophobia or visual disturbance. Respiratory: No cough, shortness of breath or wheezing. Cardiovascular: No chest pain peripheral edema, palpitations. Gastrointestinal: No abdominal distention, no abdominal pain, no change in bowel habits hematochezia or melena. Genitourinary: No dysuria, no frequency, no urgency, no nocturia. Musculoskeletal:No arthralgias, no back pain, no gait disturbance or myalgias. Neurological: No dizziness, no headaches, no numbness, no seizures, no syncope, no weakness, no tremors. Hematologic: No lymphadenopathy, no easy bruising. Psychiatric: No confusion, no hallucinations, no sleep disturbance.    Physical Exam: Filed Vitals:   02/24/13 1129  BP: 138/72  Pulse: 66   the general appearance reveals a well-developed gentleman who appears older than his stated age.The head and neck exam reveals pupils equal and reactive.  Extraocular movements are full.  There is no scleral icterus.  The mouth and pharynx  are normal.  The neck is supple.  The carotids reveal no bruits.  The jugular venous pressure is normal.  The  thyroid is not enlarged.  There is no lymphadenopathy.  The chest is clear to percussion and auscultation.  There are no rales or rhonchi.  Expansion of the chest is symmetrical.  The precordium is quiet.  The first heart sound is normal.  The second heart sound is physiologically split.  There is no murmur gallop rub or click.  There is no abnormal lift or heave.  The abdomen is soft and nontender.  The bowel sounds are normal.  The liver and spleen  are not enlarged.  There are no abdominal masses.  There are no abdominal bruits.  Extremities reveal good pedal pulses.  There is no phlebitis or edema.  There is no cyanosis or clubbing.  Strength is normal and symmetrical in all extremities.  There is no lateralizing weakness.  There are no sensory deficits.  The skin is warm and dry.  There is no rash.     Assessment / Plan: Overall the patient is doing very well.  We will give him a trial of Aricept for his memory.  Blood work today pending.  Recheck in 6 months for office visit EKG and fasting lab work and a TSH

## 2013-02-24 NOTE — Assessment & Plan Note (Signed)
The patient has not had any further gross hematuria

## 2013-08-27 ENCOUNTER — Encounter: Payer: Self-pay | Admitting: Cardiology

## 2013-08-27 ENCOUNTER — Ambulatory Visit (INDEPENDENT_AMBULATORY_CARE_PROVIDER_SITE_OTHER): Payer: Medicare Other | Admitting: Cardiology

## 2013-08-27 ENCOUNTER — Other Ambulatory Visit (INDEPENDENT_AMBULATORY_CARE_PROVIDER_SITE_OTHER): Payer: Medicare Other

## 2013-08-27 VITALS — BP 159/72 | HR 57 | Ht 67.0 in | Wt 156.0 lb

## 2013-08-27 DIAGNOSIS — I10 Essential (primary) hypertension: Secondary | ICD-10-CM

## 2013-08-27 DIAGNOSIS — I119 Hypertensive heart disease without heart failure: Secondary | ICD-10-CM

## 2013-08-27 DIAGNOSIS — R413 Other amnesia: Secondary | ICD-10-CM

## 2013-08-27 DIAGNOSIS — E785 Hyperlipidemia, unspecified: Secondary | ICD-10-CM

## 2013-08-27 NOTE — Assessment & Plan Note (Signed)
The patient is on atorvastatin.  He is tolerating it well.  No myalgias.  We are checking lipids today.

## 2013-08-27 NOTE — Assessment & Plan Note (Signed)
The patient has had no further gross hematuria

## 2013-08-27 NOTE — Patient Instructions (Signed)
Will obtain labs today and call you with the results (lp.bmet/hfp/tsh)  Your physician recommends that you continue on your current medications as directed. Please refer to the Current Medication list given to you today.  Your physician wants you to follow-up in: 6 months with fasting labs (lp/bmet/hfp)  You will receive a reminder letter in the mail two months in advance. If you don't receive a letter, please call our office to schedule the follow-up appointment.

## 2013-08-27 NOTE — Progress Notes (Signed)
Marc [name] Date of Birth:  05-02-1941 [facility] [address] [place], [place]  [address] [phone]         Fax   [phone]  History of Present Illness: This pleasant 72 year old gentleman is seen for a three-month followup office visit.  He does not speak any [place].  His son translates. He has a past history of severe hypertension with concentric LVH. He has required multiple medication to bring his blood pressure down to normal levels. He is feeling well and is having no symptoms of chest pain or shortness of breath. He is not having any side effects from the medication. His son sees to it that the patient takes his medicine. The patient also has a history of bladder problems and has been on rapaflo and reports good improvement in symptoms although he still has nocturia.  His son mentions that the patient has been having some progressive short-term memory problems.  At his last visit we added Aricept and the son states that the memory has improved.   Current Outpatient Prescriptions  Medication Sig Dispense Refill  . aspirin 81 MG tablet Take 81 mg by mouth daily.        Marland Kitchen atorvastatin (LIPITOR) 20 MG tablet Take 1 tablet (20 mg total) by mouth daily.  90 tablet  1  . cloNIDine (CATAPRES) 0.1 MG tablet Take 1 tablet (0.1 mg total) by mouth daily.  90 tablet  3  . donepezil (ARICEPT) 5 MG tablet Take 1 tablet (5 mg total) by mouth at bedtime.  30 tablet  5  . hydrochlorothiazide (HYDRODIURIL) 25 MG tablet Take 1 tablet (25 mg total) by mouth daily.  90 tablet  1  . lisinopril (PRINIVIL,ZESTRIL) 20 MG tablet Take 1 tablet (20 mg total) by mouth daily.  90 tablet  1  . metoprolol tartrate (LOPRESSOR) 25 MG tablet Take 1 tablet (25 mg total) by mouth 2 (two) times daily.  180 tablet  3  . RAPAFLO 8 MG CAPS capsule 8 mg daily.        No current facility-administered medications for this visit.    No Known Allergies  Patient Active Problem List   Diagnosis Date Noted  . Age-related memory disorder 02/24/2013  . HTN (hypertension) 10/02/2011  . Hyperlipidemia 10/02/2011  . Hematuria 10/02/2011    History  Smoking status  . Never Smoker   Smokeless tobacco  . Not on file    History  Alcohol Use  . Yes    Comment: OCCASSIONAL    No family history on file.  Review of Systems: Constitutional: no fever chills diaphoresis or fatigue or change in weight.  Head and neck: no hearing loss, no epistaxis, no photophobia or visual disturbance. Respiratory: No cough, shortness of breath or wheezing. Cardiovascular: No chest pain peripheral edema, palpitations. Gastrointestinal: No abdominal distention, no abdominal pain, no change in bowel habits hematochezia or melena. Genitourinary: No dysuria, no frequency, no urgency, no nocturia. Musculoskeletal:No arthralgias, no back pain, no gait disturbance or myalgias. Neurological: No dizziness, no headaches, no numbness, no seizures, no syncope, no weakness, no tremors. Hematologic: No lymphadenopathy, no easy bruising. Psychiatric: No confusion, no hallucinations, no sleep disturbance.    Physical Exam: Filed Vitals:   08/27/13 0849  BP: 159/72  Pulse: 57   the general appearance reveals a well-developed gentleman who appears older than his stated age.The head and neck exam reveals pupils equal and reactive.  Extraocular movements are full.  There is no  scleral icterus.  The mouth and pharynx are normal.  The neck is supple.  The carotids reveal no bruits.  The jugular venous pressure is normal.  The  thyroid is not enlarged.  There is no lymphadenopathy.  The chest is clear to percussion and auscultation.  There are no rales or rhonchi.  Expansion of the chest is symmetrical.  The precordium is quiet.  The first heart sound is normal.  The second heart sound is physiologically split.  There is no murmur gallop rub or click.  There is no abnormal lift or heave.  The abdomen is soft and  nontender.  The bowel sounds are normal.  The liver and spleen are not enlarged.  There are no abdominal masses.  There are no abdominal bruits.  Extremities reveal good pedal pulses.  There is no phlebitis or edema.  There is no cyanosis or clubbing.  Strength is normal and symmetrical in all extremities.  There is no lateralizing weakness.  There are no sensory deficits.  The skin is warm and dry.  There is no rash.   EKG today shows normal sinus rhythm and is within normal limits.  Heart rate 54  Assessment / Plan: Continue same medication.  Await today's lab work.  We are also checking TSH. Return in 6 months for followup office visit and fasting lab work

## 2013-08-27 NOTE — Assessment & Plan Note (Signed)
Family states that memory has improved since starting Aricept

## 2013-08-27 NOTE — Assessment & Plan Note (Signed)
The patient has not been experiencing any chest pain.  No shortness of breath.  No palpitations.  No dizziness or syncope.  No headaches.

## 2013-08-28 LAB — LIPID PANEL
LDL Cholesterol: 66 mg/dL (ref 0–99)
Total CHOL/HDL Ratio: 3
Triglycerides: 159 mg/dL — ABNORMAL HIGH (ref 0.0–149.0)

## 2013-08-28 LAB — HEPATIC FUNCTION PANEL
AST: 27 U/L (ref 0–37)
Alkaline Phosphatase: 72 U/L (ref 39–117)
Bilirubin, Direct: 0.1 mg/dL (ref 0.0–0.3)
Total Bilirubin: 0.8 mg/dL (ref 0.3–1.2)

## 2013-08-28 LAB — BASIC METABOLIC PANEL
BUN: 19 mg/dL (ref 6–23)
CO2: 27 mEq/L (ref 19–32)
Calcium: 9.1 mg/dL (ref 8.4–10.5)
Chloride: 104 mEq/L (ref 96–112)
Creatinine, Ser: 1.4 mg/dL (ref 0.4–1.5)
Glucose, Bld: 94 mg/dL (ref 70–99)

## 2013-08-30 NOTE — Progress Notes (Signed)
Quick Note:  Please report to patient. The recent labs are stable. Continue same medication and careful diet. ______ 

## 2013-09-02 ENCOUNTER — Telehealth: Payer: Self-pay | Admitting: Cardiology

## 2013-09-02 NOTE — Telephone Encounter (Signed)
Advised son of labs, no changes continue same dose of medications

## 2013-09-02 NOTE — Telephone Encounter (Signed)
New problem:  Pt states he is calling in to find out his lab results.

## 2013-10-02 ENCOUNTER — Other Ambulatory Visit: Payer: Self-pay | Admitting: Cardiology

## 2014-01-12 ENCOUNTER — Other Ambulatory Visit: Payer: Self-pay | Admitting: Cardiology

## 2014-03-12 ENCOUNTER — Other Ambulatory Visit: Payer: Self-pay | Admitting: Cardiology

## 2014-03-23 ENCOUNTER — Ambulatory Visit (INDEPENDENT_AMBULATORY_CARE_PROVIDER_SITE_OTHER): Payer: Private Health Insurance - Indemnity | Admitting: Cardiology

## 2014-03-23 ENCOUNTER — Encounter: Payer: Self-pay | Admitting: Cardiology

## 2014-03-23 ENCOUNTER — Other Ambulatory Visit: Payer: Private Health Insurance - Indemnity

## 2014-03-23 VITALS — BP 112/80 | HR 61 | Wt 158.0 lb

## 2014-03-23 DIAGNOSIS — E785 Hyperlipidemia, unspecified: Secondary | ICD-10-CM

## 2014-03-23 DIAGNOSIS — R413 Other amnesia: Secondary | ICD-10-CM

## 2014-03-23 DIAGNOSIS — I10 Essential (primary) hypertension: Secondary | ICD-10-CM

## 2014-03-23 DIAGNOSIS — I119 Hypertensive heart disease without heart failure: Secondary | ICD-10-CM

## 2014-03-23 LAB — HEPATIC FUNCTION PANEL
ALK PHOS: 66 U/L (ref 39–117)
ALT: 40 U/L (ref 0–53)
AST: 29 U/L (ref 0–37)
Albumin: 4.1 g/dL (ref 3.5–5.2)
BILIRUBIN DIRECT: 0 mg/dL (ref 0.0–0.3)
BILIRUBIN TOTAL: 0.7 mg/dL (ref 0.3–1.2)
TOTAL PROTEIN: 7.4 g/dL (ref 6.0–8.3)

## 2014-03-23 LAB — LIPID PANEL
Cholesterol: 143 mg/dL (ref 0–200)
HDL: 46.9 mg/dL (ref >39.00)
LDL Cholesterol: 76 mg/dL (ref 0–99)
Total CHOL/HDL Ratio: 3
Triglycerides: 101 mg/dL (ref 0.0–149.0)
VLDL: 20.2 mg/dL (ref 0.0–40.0)

## 2014-03-23 LAB — BASIC METABOLIC PANEL
BUN: 30 mg/dL — AB (ref 6–23)
CHLORIDE: 101 meq/L (ref 96–112)
CO2: 28 mEq/L (ref 19–32)
CREATININE: 1.7 mg/dL — AB (ref 0.4–1.5)
Calcium: 9.4 mg/dL (ref 8.4–10.5)
GFR: 42.27 mL/min — AB (ref >60.00)
GLUCOSE: 98 mg/dL (ref 70–99)
Potassium: 4.3 mEq/L (ref 3.5–5.1)
Sodium: 137 mEq/L (ref 135–145)

## 2014-03-23 NOTE — Patient Instructions (Signed)
Will obtain labs today and call you with the results (lp/bmet/hfp)  Your physician recommends that you continue on your current medications as directed. Please refer to the Current Medication list given to you today.  Your physician wants you to follow-up in: 6 months with fasting labs (lp/bmet/hfp)  You will receive a reminder letter in the mail two months in advance. If you don't receive a letter, please call our office to schedule the follow-up appointment.  

## 2014-03-23 NOTE — Assessment & Plan Note (Signed)
The patient is not having any side effects from his 20 mg Lipitor.  Blood work today is pending.

## 2014-03-23 NOTE — Assessment & Plan Note (Signed)
Stable on Aricept.

## 2014-03-23 NOTE — Assessment & Plan Note (Signed)
Blood pressure is controlled on current medication.  No headaches dizziness or syncope

## 2014-03-23 NOTE — Progress Notes (Signed)
Wende Crease Date of Birth:  Jun 09, 1941 [facility] [address] [place], [place]  [address] [phone]        Fax   [phone]   History of Present Illness: This pleasant 73 year old gentleman is seen for a six-month followup office visit. He does not speak any [place]. His son translates. He has a past history of severe hypertension with concentric LVH. He has required multiple medication to bring his blood pressure down to normal levels. He is feeling well and is having no symptoms of chest pain or shortness of breath. He is not having any side effects from the medication. His son sees to it that the patient takes his medicine. The patient also has a history of bladder problems and has been on rapaflo and reports good improvement in symptoms although he still has nocturia.  He is no longer having any hematuria. His son mentions that the patient has been having some progressive short-term memory problems. At his last visit we added Aricept and the son states that the memory has improved.   Current Outpatient Prescriptions  Medication Sig Dispense Refill  . aspirin 81 MG tablet Take 81 mg by mouth daily.        Marland Kitchen atorvastatin (LIPITOR) 20 MG tablet TAKE 1 TABLET BY MOUTH EVERY DAY  90 tablet  0  . cloNIDine (CATAPRES) 0.1 MG tablet TAKE 1 TABLET BY MOUTH DAILY  90 tablet  0  . donepezil (ARICEPT) 5 MG tablet TAKE 1 TABLET BY MOUTH EVERY NIGHT AT BEDTIME  30 tablet  6  . hydrochlorothiazide (HYDRODIURIL) 25 MG tablet TAKE 1 TABLET BY MOUTH DAILY  90 tablet  0  . lisinopril (PRINIVIL,ZESTRIL) 20 MG tablet Take 1 tablet (20 mg total) by mouth daily.  90 tablet  1  . metoprolol tartrate (LOPRESSOR) 25 MG tablet Take 1 tablet (25 mg total) by mouth 2 (two) times daily.  180 tablet  3  . RAPAFLO 8 MG CAPS capsule 8 mg daily.        No current facility-administered medications for this visit.    No Known Allergies  Patient Active Problem List   Diagnosis Date  Noted  . Age-related memory disorder 02/24/2013  . HTN (hypertension) 10/02/2011  . Hyperlipidemia 10/02/2011  . Hematuria 10/02/2011    History  Smoking status  . Never Smoker   Smokeless tobacco  . Not on file    History  Alcohol Use  . Yes    Comment: OCCASSIONAL    No family history on file.  Review of Systems: Constitutional: no fever chills diaphoresis or fatigue or change in weight.  Head and neck: no hearing loss, no epistaxis, no photophobia or visual disturbance. Respiratory: No cough, shortness of breath or wheezing. Cardiovascular: No chest pain peripheral edema, palpitations. Gastrointestinal: No abdominal distention, no abdominal pain, no change in bowel habits hematochezia or melena. Genitourinary: No dysuria, no frequency, no urgency, no nocturia. Musculoskeletal:No arthralgias, no back pain, no gait disturbance or myalgias. Neurological: No dizziness, no headaches, no numbness, no seizures, no syncope, no weakness, no tremors. Hematologic: No lymphadenopathy, no easy bruising. Psychiatric: No confusion, no hallucinations, no sleep disturbance.    Physical Exam: Filed Vitals:   03/23/14 0831  BP: 112/80  Pulse: 61    the general appearance reveals a well-developed gentleman who appears older than his stated age.The head and neck exam reveals pupils equal and reactive. Extraocular movements are full. There is no scleral icterus. The mouth  and pharynx are normal. The neck is supple. The carotids reveal no bruits. The jugular venous pressure is normal. The thyroid is not enlarged. There is no lymphadenopathy. The chest is clear to percussion and auscultation. There are no rales or rhonchi. Expansion of the chest is symmetrical. The precordium is quiet. The first heart sound is normal. The second heart sound is physiologically split. There is no murmur gallop rub or click. There is no abnormal lift or heave. The abdomen is soft and nontender. The bowel sounds  are normal. The liver and spleen are not enlarged. There are no abdominal masses. There are no abdominal bruits. Extremities reveal good pedal pulses. There is no phlebitis or edema. There is no cyanosis or clubbing. Strength is normal and symmetrical in all extremities. There is no lateralizing weakness. There are no sensory deficits. The skin is warm and dry. There is no rash.  Assessment / Plan:  1.  Hypertensive cardiovascular disease. 2.  Hypercholesterolemia 3.  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  Continue same medication.  Recheck in 6 months for office visit lipid panel hepatic function panel and basal metabolic panel

## 2014-03-23 NOTE — Assessment & Plan Note (Signed)
Hematuria has resolved.

## 2014-03-24 NOTE — Progress Notes (Signed)
Quick Note:  Please report to patient. The recent labs are stable. Continue same medication and careful diet. The kidneys are too dry. Decrease HCTZ to just half a tablet daily. Drink plenty of water. ______

## 2014-03-30 ENCOUNTER — Telehealth: Payer: Self-pay | Admitting: Cardiology

## 2014-03-30 NOTE — Telephone Encounter (Signed)
New message ° ° ° ° °Want test results °

## 2014-03-30 NOTE — Telephone Encounter (Signed)
Pt is aware of lab results and MD's recommendations. Pt to decreased the dose of HCTZ to 1/2 tablet (12.5) once a day pt is to drink platy of water. Pt's relative verbalized understanding.

## 2014-03-31 NOTE — Addendum Note (Signed)
Addended by: Alvina Filbert B on: 03/31/2014 02:31 PM   Modules accepted: Orders

## 2014-04-20 ENCOUNTER — Other Ambulatory Visit: Payer: Self-pay | Admitting: Cardiology

## 2014-05-26 ENCOUNTER — Other Ambulatory Visit: Payer: Self-pay | Admitting: Cardiology

## 2014-06-01 ENCOUNTER — Other Ambulatory Visit: Payer: Self-pay | Admitting: Cardiology

## 2014-06-01 NOTE — Telephone Encounter (Signed)
Rx was sent to pharmacy electronically. 

## 2014-07-16 ENCOUNTER — Other Ambulatory Visit: Payer: Self-pay | Admitting: Cardiology

## 2014-07-26 ENCOUNTER — Ambulatory Visit (INDEPENDENT_AMBULATORY_CARE_PROVIDER_SITE_OTHER): Payer: Medicare HMO | Admitting: Family Medicine

## 2014-07-26 VITALS — BP 124/62 | HR 69 | Temp 97.5°F | Resp 16 | Ht 63.0 in | Wt 158.0 lb

## 2014-07-26 DIAGNOSIS — R413 Other amnesia: Secondary | ICD-10-CM

## 2014-07-26 DIAGNOSIS — Z23 Encounter for immunization: Secondary | ICD-10-CM

## 2014-07-26 DIAGNOSIS — Z0000 Encounter for general adult medical examination without abnormal findings: Secondary | ICD-10-CM

## 2014-07-26 NOTE — Patient Instructions (Addendum)
We will give Marc [name] a flu shot and updated pneumonia vaccine today. If he has not had tetanus vaccine within 10 years, then would need to return for this.  I recommend referral to gastroenterologist for colonoscopy for colon cancer screening - let me know if I can refer him for this.  If he would like to have prostate cancer screening - this can also be done on blood work and exam in office - here or other primary provider.  We can see Marc [name] anytime for acute issues, but with his memory issues a geriatrician may be a better option for primary care provider -  Here is a number for their office:[phone].  WE can see him here if needed, but look into their office to see if it would be a better fit. Please let me know if there are any other questions.   Return to the clinic or go to the nearest emergency room if any of your symptoms worsen or new symptoms occur.  Keeping you healthy  Get these tests  Blood pressure- Have your blood pressure checked once a year by your healthcare provider.  Normal blood pressure is 120/80  Weight- Have your body mass index (BMI) calculated to screen for obesity.  BMI is a measure of body fat based on height and weight. You can also calculate your own BMI at [email or web address].  Cholesterol- Have your cholesterol checked every year.  Diabetes- Have your blood sugar checked regularly if you have high blood pressure, high cholesterol, have a family history of diabetes or if you are overweight.  Screening for Colon Cancer- Colonoscopy starting at age 75.  Screening may begin sooner depending on your family history and other health conditions. Follow up colonoscopy as directed by your Gastroenterologist.  Screening for Prostate Cancer- Both blood work (PSA) and a rectal exam help screen for Prostate Cancer.  Screening begins at age 38 with African-American men and at age 2 with Caucasian men.  Screening may begin sooner depending on your family history.  Take  these medicines  Aspirin- One aspirin daily can help prevent Heart disease and Stroke.  Flu shot- Every fall.  Tetanus- Every 10 years.  Zostavax- Once after the age of 35 to prevent Shingles.  Pneumonia shot- Once after the age of 70; if you are younger than 19, ask your healthcare provider if you need a Pneumonia shot.  Take these steps  Don't smoke- If you do smoke, talk to your doctor about quitting.  For tips on how to quit, go to www.smokefree.gov or call 1-800-QUIT-NOW.  Be physically active- Exercise 5 days a week for at least 30 minutes.  If you are not already physically active start slow and gradually work up to 30 minutes of moderate physical activity.  Examples of moderate activity include walking briskly, mowing the yard, dancing, swimming, bicycling, etc.  Eat a healthy diet- Eat a variety of healthy food such as fruits, vegetables, low fat milk, low fat cheese, yogurt, lean meant, poultry, fish, beans, tofu, etc. For more information go to www.thenutritionsource.org  Drink alcohol in moderation- Limit alcohol intake to less than two drinks a day. Never drink and drive.  Dentist- Brush and floss twice daily; visit your dentist twice a year.  Depression- Your emotional health is as important as your physical health. If you're feeling down, or losing interest in things you would normally enjoy please talk to your healthcare provider.  Eye exam- Visit your eye doctor every year.  Safe sex- If  you may be exposed to a sexually transmitted infection, use a condom.  Seat belts- Seat belts can save your life; always wear one.  Smoke/Carbon Monoxide detectors- These detectors need to be installed on the appropriate level of your home.  Replace batteries at least once a year.  Skin cancer- When out in the sun, cover up and use sunscreen 15 SPF or higher.  Violence- If anyone is threatening you, please tell your healthcare provider.  Living Will/ Health care power of  attorney- Speak with your healthcare provider and family.

## 2014-07-26 NOTE — Progress Notes (Addendum)
Subjective:    Patient ID: Marc [name], male    DOB: October 10, 1941, 73 y.o.   MRN: [id number] This chart was scribed for Marc Ray, MD by Cathie Hoops, ED Scribe. The patient was seen in Room 13. The patient's care was started at 12:13 PM.   07/26/2014  Annual Exam and Flu Vaccine  HPI HPI Comments: Marc [name] is a 73 y.o. male who presents to the [facility] for complete physical exam. Last seen here in 2012. Followed by Dr. Mare Ferrari for hx of HTN, hyperlipidemia, memory disorder for which he takes Aricept 5 mg q.d. Last seen by Dr Mare Ferrari in April 2015. Plan for follow-up cardiology visit in October. Sounds like he had Pneumovax at age 69. Pt was last seen by me in 2012, the pt had some confusion and memory issues then. He had a CT scan in 2012 with no chronic microvascular disease.   Pt denies he has a neurologist. Pt's son states the pt's memory has worsened over the past year. Pt's son reports the pt has been taking Aricept for the past 6 months. Pt's son reports his brother has been assisting in his father's  Care and he is more aware of his medical history.    Health Maintenance Tdap: <10 years Pneumonia Vaccine: Pneumovax in 2005 Colonoscopy: Never done, but son defers to his brother who knows more health hx.  Prostate Cancer Screening: Never done, but son defers to his brother who knows more health hx.  Advanced Directives: Unable to specify - son here thinks his brother may have this information.  Flu shot will be given today  Functional status - as above - deferred to other son more aware of his health hx.   No PCP.  Language barrier - son translating   Review of Systems  Psychiatric/Behavioral: Positive for confusion.  All other systems reviewed and are negative. per patient health survey.   Past Medical History  Diagnosis Date  . Hypertension     has severe LVH per echo in November 2012  . Hyperlipidemia   . BPH (benign prostatic  hypertrophy)     WITH MICROSCOPIC HEMATURIA  . Hematuria    History reviewed. No pertinent past surgical history. No Known Allergies Current Outpatient Prescriptions  Medication Sig Dispense Refill  . aspirin 81 MG tablet Take 81 mg by mouth daily.        Marland Kitchen atorvastatin (LIPITOR) 20 MG tablet TAKE 1 TABLET BY MOUTH EVERY DAY  90 tablet  0  . cloNIDine (CATAPRES) 0.1 MG tablet TAKE 1 TABLET BY MOUTH DAILY  90 tablet  0  . donepezil (ARICEPT) 5 MG tablet TAKE 1 TABLET BY MOUTH EVERY NIGHT AT BEDTIME  30 tablet  0  . hydrochlorothiazide (HYDRODIURIL) 25 MG tablet TAKE 1 TABLET DAILY  90 tablet  0  . lisinopril (PRINIVIL,ZESTRIL) 20 MG tablet TAKE 1 TABLET BY MOUTH DAILY  90 tablet  2  . metoprolol tartrate (LOPRESSOR) 25 MG tablet TAKE 1 TABLET BY MOUTH TWICE DAILY  180 tablet  0  . RAPAFLO 8 MG CAPS capsule 8 mg daily.        No current facility-administered medications for this visit.       Objective:  Physical Exam  Nursing note and vitals reviewed. Constitutional: He appears well-developed and well-nourished. No distress.  HENT:  Head: Normocephalic and atraumatic.  Mouth/Throat: Oropharynx is clear and moist and mucous membranes are normal. No oropharyngeal exudate.  Moist oral mucosa.  Eyes: Conjunctivae and EOM are normal. Pupils are equal, round, and reactive to light. Right eye exhibits no discharge. Left eye exhibits no discharge. No scleral icterus.  Neck: Normal range of motion. Neck supple. No thyromegaly present.  Cardiovascular: Normal rate, regular rhythm, normal heart sounds and intact distal pulses.  Exam reveals no gallop and no friction rub.   No murmur heard. Pulmonary/Chest: Effort normal and breath sounds normal. No respiratory distress. He has no wheezes. He has no rales.  Abdominal: Soft. Bowel sounds are normal. He exhibits no distension and no mass. There is no tenderness.  Musculoskeletal: Normal range of motion. He exhibits no edema and no tenderness.    No lower extremity edema.  Neurological: He is alert. Coordination normal.  Skin: Skin is warm and dry. No rash noted. No erythema.  Psychiatric: He has a normal mood and affect. His behavior is normal. He is not agitated and not slowed.  Interactive to direct interaction, but flat affect.     Filed Vitals:   07/26/14 1010  BP: 124/62  Pulse: 69  Temp: 97.5 F (36.4 C)  TempSrc: Oral  Resp: 16  Height: 5\' 3"  (1.6 m)  Weight: 158 lb (71.668 kg)  SpO2: 98%    Visual Acuity Screening   Right eye Left eye Both eyes  Without correction:     With correction: 20/40 20/50 20/40    Fall & Depression screen in [facility]. Negative for fall screening and PHQ2: 0.   Assessment & Plan:  Marc [name] is a 73 y.o. male Routine general medical examination at a health care facility - Plan: Flu Vaccine QUAD 36+ mos IM, Pneumococcal conjugate vaccine 13-valent IM  --anticipatory guidance as below in AVS, screening lipids and some other labs have been checked by cardiologist. Health maintenance items as above in HPI discussed/recommended as applicable. Discussed colonoscopy and prostate cancer testing, but unsure if this has been done. Son plans on discussing with patient's other son to determine if he has had this done, or if they would like to proceed with these tests if not done.   Memory disorder - currently on Aricept,  May need further eval/neuro eval, but also discussed [facility] for geriatrician as possible PCP. Info provided or can follow up here if they would prefer.   Need for prophylactic vaccination and inoculation against influenza - flu vaccine given.   Need for prophylactic vaccination against Streptococcus pneumoniae (pneumococcus) - prevnar given as thought he has had pneumovax.    No orders of the defined types were placed in this encounter.   Patient Instructions  We will give [name] a flu shot and updated pneumonia vaccine today. If he has not had tetanus vaccine  within 10 years, then would need to return for this.  I recommend referral to gastroenterologist for colonoscopy for colon cancer screening - let me know if I can refer him for this.  If he would like to have prostate cancer screening - this can also be done on blood work and exam in office - here or other primary provider.  We can see Mr. Vizzini anytime for acute issues, but with his memory issues a geriatrician may be a better option for primary care provider -  Here is a number for their office:[phone].  WE can see him here if needed, but look into their office to see if it would be a better fit. Please let me know if there are any other questions.   Return to the clinic  or go to the nearest emergency room if any of your symptoms worsen or new symptoms occur.  Keeping you healthy  Get these tests  Blood pressure- Have your blood pressure checked once a year by your healthcare provider.  Normal blood pressure is 120/80  Weight- Have your body mass index (BMI) calculated to screen for obesity.  BMI is a measure of body fat based on height and weight. You can also calculate your own BMI at [email or web address].  Cholesterol- Have your cholesterol checked every year.  Diabetes- Have your blood sugar checked regularly if you have high blood pressure, high cholesterol, have a family history of diabetes or if you are overweight.  Screening for Colon Cancer- Colonoscopy starting at age 40.  Screening may begin sooner depending on your family history and other health conditions. Follow up colonoscopy as directed by your Gastroenterologist.  Screening for Prostate Cancer- Both blood work (PSA) and a rectal exam help screen for Prostate Cancer.  Screening begins at age 69 with African-American men and at age 54 with Caucasian men.  Screening may begin sooner depending on your family history.  Take these medicines  Aspirin- One aspirin daily can help prevent Heart disease and Stroke.  Flu  shot- Every fall.  Tetanus- Every 10 years.  Zostavax- Once after the age of 44 to prevent Shingles.  Pneumonia shot- Once after the age of 98; if you are younger than 14, ask your healthcare provider if you need a Pneumonia shot.  Take these steps  Don't smoke- If you do smoke, talk to your doctor about quitting.  For tips on how to quit, go to www.smokefree.gov or call 1-800-QUIT-NOW.  Be physically active- Exercise 5 days a week for at least 30 minutes.  If you are not already physically active start slow and gradually work up to 30 minutes of moderate physical activity.  Examples of moderate activity include walking briskly, mowing the yard, dancing, swimming, bicycling, etc.  Eat a healthy diet- Eat a variety of healthy food such as fruits, vegetables, low fat milk, low fat cheese, yogurt, lean meant, poultry, fish, beans, tofu, etc. For more information go to www.thenutritionsource.org  Drink alcohol in moderation- Limit alcohol intake to less than two drinks a day. Never drink and drive.  Dentist- Brush and floss twice daily; visit your dentist twice a year.  Depression- Your emotional health is as important as your physical health. If you're feeling down, or losing interest in things you would normally enjoy please talk to your healthcare provider.  Eye exam- Visit your eye doctor every year.  Safe sex- If you may be exposed to a sexually transmitted infection, use a condom.  Seat belts- Seat belts can save your life; always wear one.  Smoke/Carbon Monoxide detectors- These detectors need to be installed on the appropriate level of your home.  Replace batteries at least once a year.  Skin cancer- When out in the sun, cover up and use sunscreen 15 SPF or higher.  Violence- If anyone is threatening you, please tell your healthcare provider.  Living Will/ Health care power of attorney- Speak with your healthcare provider and family.      I personally performed the  services described in this documentation, which was scribed in my presence. The recorded information has been reviewed and considered, and addended by me as needed.  Marland Kitchen

## 2014-08-07 ENCOUNTER — Other Ambulatory Visit: Payer: Self-pay | Admitting: Cardiology

## 2014-08-09 ENCOUNTER — Other Ambulatory Visit: Payer: Self-pay | Admitting: Cardiology

## 2014-09-29 ENCOUNTER — Ambulatory Visit (INDEPENDENT_AMBULATORY_CARE_PROVIDER_SITE_OTHER): Payer: Private Health Insurance - Indemnity | Admitting: Cardiology

## 2014-09-29 ENCOUNTER — Other Ambulatory Visit (INDEPENDENT_AMBULATORY_CARE_PROVIDER_SITE_OTHER): Payer: Private Health Insurance - Indemnity | Admitting: *Deleted

## 2014-09-29 VITALS — BP 122/62 | HR 58 | Ht 63.0 in | Wt 156.0 lb

## 2014-09-29 DIAGNOSIS — I10 Essential (primary) hypertension: Secondary | ICD-10-CM

## 2014-09-29 DIAGNOSIS — E785 Hyperlipidemia, unspecified: Secondary | ICD-10-CM

## 2014-09-29 DIAGNOSIS — I119 Hypertensive heart disease without heart failure: Secondary | ICD-10-CM

## 2014-09-29 DIAGNOSIS — R413 Other amnesia: Secondary | ICD-10-CM

## 2014-09-29 LAB — LIPID PANEL
CHOL/HDL RATIO: 4
CHOLESTEROL: 169 mg/dL (ref 0–200)
HDL: 42.2 mg/dL (ref >39.00)
LDL Cholesterol: 87 mg/dL (ref 0–99)
NonHDL: 126.8
TRIGLYCERIDES: 197 mg/dL — AB (ref 0.0–149.0)
VLDL: 39.4 mg/dL (ref 0.0–40.0)

## 2014-09-29 LAB — BASIC METABOLIC PANEL
BUN: 23 mg/dL (ref 6–23)
CALCIUM: 9.1 mg/dL (ref 8.4–10.5)
CO2: 25 mEq/L (ref 19–32)
CREATININE: 1.6 mg/dL — AB (ref 0.4–1.5)
Chloride: 104 mEq/L (ref 96–112)
GFR: 46.26 mL/min — ABNORMAL LOW (ref >60.00)
Glucose, Bld: 97 mg/dL (ref 70–99)
Potassium: 4.1 mEq/L (ref 3.5–5.1)
Sodium: 137 mEq/L (ref 135–145)

## 2014-09-29 LAB — HEPATIC FUNCTION PANEL
ALK PHOS: 71 U/L (ref 39–117)
ALT: 25 U/L (ref 0–53)
AST: 25 U/L (ref 0–37)
Albumin: 3.5 g/dL (ref 3.5–5.2)
BILIRUBIN DIRECT: 0 mg/dL (ref 0.0–0.3)
TOTAL PROTEIN: 7.6 g/dL (ref 6.0–8.3)
Total Bilirubin: 0.7 mg/dL (ref 0.2–1.2)

## 2014-09-29 NOTE — Assessment & Plan Note (Signed)
No chest pain.  No shortness of breath.  No dizziness or syncope.

## 2014-09-29 NOTE — Progress Notes (Signed)
Quick Note:  Please report to patient. The recent labs are stable. Continue same medication and careful diet. ______ 

## 2014-09-29 NOTE — Progress Notes (Signed)
Wende Crease Date of Birth:  [birth year] [facility] [address] [place], [place]  [address] [phone]        Fax   [phone]   History of Present Illness: This pleasant 73 year old gentleman is seen for a six-month followup office visit. He does not speak any [place]. His son translates. He has a past history of severe hypertension with concentric LVH. He has required multiple medication to bring his blood pressure down to normal levels. He is feeling well and is having no symptoms of chest pain or shortness of breath. He is not having any side effects from the medication. His son sees to it that the patient takes his medicine. The patient also has a history of bladder problems and has been on rapaflo and reports good improvement in symptoms although he still has nocturia.  He is no longer having any hematuria. His son mentions that the patient has been having some progressive short-term memory problems. The patient continues to have improved memory since starting Aricept.   Current Outpatient Prescriptions  Medication Sig Dispense Refill  . aspirin 81 MG tablet Take 81 mg by mouth daily.      Marland Kitchen atorvastatin (LIPITOR) 20 MG tablet TAKE 1 TABLET BY MOUTH DAILY 90 tablet 0  . cloNIDine (CATAPRES) 0.1 MG tablet TAKE 1 TABLET BY MOUTH DAILY 90 tablet 0  . donepezil (ARICEPT) 5 MG tablet TAKE 1 TABLET BY MOUTH EVERY NIGHT AT BEDTIME 30 tablet 0  . hydrochlorothiazide (HYDRODIURIL) 25 MG tablet TAKE 1 TABLET BY MOUTH DAILY 30 tablet 0  . lisinopril (PRINIVIL,ZESTRIL) 20 MG tablet TAKE 1 TABLET BY MOUTH DAILY 90 tablet 2  . metoprolol tartrate (LOPRESSOR) 25 MG tablet TAKE 1 TABLET BY MOUTH TWICE DAILY 180 tablet 0  . RAPAFLO 8 MG CAPS capsule 8 mg daily.      No current facility-administered medications for this visit.    No Known Allergies  Patient Active Problem List   Diagnosis Date Noted  . Age-related memory disorder 02/24/2013  . HTN (hypertension)  10/02/2011  . Hyperlipidemia 10/02/2011  . Hematuria 10/02/2011    History  Smoking status  . Never Smoker   Smokeless tobacco  . Not on file    History  Alcohol Use  . Yes    Comment: OCCASSIONAL    No family history on file.  Review of Systems: Constitutional: no fever chills diaphoresis or fatigue or change in weight.  Head and neck: no hearing loss, no epistaxis, no photophobia or visual disturbance. Respiratory: No cough, shortness of breath or wheezing. Cardiovascular: No chest pain peripheral edema, palpitations. Gastrointestinal: No abdominal distention, no abdominal pain, no change in bowel habits hematochezia or melena. Genitourinary: No dysuria, no frequency, no urgency, no nocturia. Musculoskeletal:No arthralgias, no back pain, no gait disturbance or myalgias. Neurological: No dizziness, no headaches, no numbness, no seizures, no syncope, no weakness, no tremors. Hematologic: No lymphadenopathy, no easy bruising. Psychiatric: No confusion, no hallucinations, no sleep disturbance.    Physical Exam: Filed Vitals:   09/29/14 0857  BP: 122/62  Pulse: 58    the general appearance reveals a well-developed gentleman who appears older than his stated age.The head and neck exam reveals pupils equal and reactive. Extraocular movements are full. There is no scleral icterus. The mouth and pharynx are normal. The neck is supple. The carotids reveal no bruits. The jugular venous pressure is normal. The thyroid is not enlarged. There is no lymphadenopathy. The chest is  clear to percussion and auscultation. There are no rales or rhonchi. Expansion of the chest is symmetrical. The precordium is quiet. The first heart sound is normal. The second heart sound is physiologically split. There is no murmur gallop rub or click. There is no abnormal lift or heave. The abdomen is soft and nontender. The bowel sounds are normal. The liver and spleen are not enlarged. There are no abdominal  masses. There are no abdominal bruits. Extremities reveal good pedal pulses. There is no phlebitis or edema. There is no cyanosis or clubbing. Strength is normal and symmetrical in all extremities. There is no lateralizing weakness. There are no sensory deficits. The skin is warm and dry. There is no rash.  EKG today shows sinus bradycardia and is within normal limits.  Assessment / Plan:  1.  Hypertensive cardiovascular disease. 2.  Hypercholesterolemia 3.  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  Continue same medication.  Recheck in 6 months for office visit lipid panel hepatic function panel and basal metabolic panel

## 2014-09-29 NOTE — Patient Instructions (Signed)
Will obtain labs today and call you with the results (lp/bmet/hfp)  Your physician recommends that you continue on your current medications as directed. Please refer to the Current Medication list given to you today.  Your physician wants you to follow-up in: 6 months with fasting labs (lp/bmet/hfp)  You will receive a reminder letter in the mail two months in advance. If you don't receive a letter, please call our office to schedule the follow-up appointment.  

## 2014-09-29 NOTE — Assessment & Plan Note (Signed)
Improved since starting Aricept.  The family is pleased.

## 2014-09-29 NOTE — Assessment & Plan Note (Signed)
We are checking lab work today.  He is not having any side effects from his 20 mg Lipitor.

## 2014-10-09 ENCOUNTER — Other Ambulatory Visit: Payer: Self-pay | Admitting: Cardiology

## 2014-10-18 ENCOUNTER — Other Ambulatory Visit: Payer: Self-pay | Admitting: Cardiology

## 2014-10-18 NOTE — Telephone Encounter (Signed)
Patients son states that the patient has been out of this medication for a few days and requested that I ask you if it could be expedited. Please advise. Thanks, MI

## 2015-01-02 ENCOUNTER — Other Ambulatory Visit: Payer: Self-pay | Admitting: Cardiology

## 2015-04-02 ENCOUNTER — Other Ambulatory Visit: Payer: Self-pay | Admitting: Cardiology

## 2015-04-02 DIAGNOSIS — R413 Other amnesia: Secondary | ICD-10-CM

## 2015-04-05 ENCOUNTER — Other Ambulatory Visit (INDEPENDENT_AMBULATORY_CARE_PROVIDER_SITE_OTHER): Payer: Medicare HMO | Admitting: *Deleted

## 2015-04-05 ENCOUNTER — Encounter: Payer: Self-pay | Admitting: Cardiology

## 2015-04-05 ENCOUNTER — Ambulatory Visit (INDEPENDENT_AMBULATORY_CARE_PROVIDER_SITE_OTHER): Payer: Medicare HMO | Admitting: Cardiology

## 2015-04-05 VITALS — BP 162/80 | HR 60 | Ht 63.0 in | Wt 157.4 lb

## 2015-04-05 DIAGNOSIS — E785 Hyperlipidemia, unspecified: Secondary | ICD-10-CM

## 2015-04-05 DIAGNOSIS — I119 Hypertensive heart disease without heart failure: Secondary | ICD-10-CM

## 2015-04-05 DIAGNOSIS — R413 Other amnesia: Secondary | ICD-10-CM

## 2015-04-05 LAB — BASIC METABOLIC PANEL
BUN: 36 mg/dL — ABNORMAL HIGH (ref 6–23)
CALCIUM: 9.7 mg/dL (ref 8.4–10.5)
CO2: 29 meq/L (ref 19–32)
Chloride: 102 mEq/L (ref 96–112)
Creatinine, Ser: 1.59 mg/dL — ABNORMAL HIGH (ref 0.40–1.50)
GFR: 45.53 mL/min — ABNORMAL LOW (ref >60.00)
Glucose, Bld: 105 mg/dL — ABNORMAL HIGH (ref 70–99)
Potassium: 4.4 mEq/L (ref 3.5–5.1)
Sodium: 136 mEq/L (ref 135–145)

## 2015-04-05 LAB — CBC WITH DIFFERENTIAL/PLATELET
BASOS PCT: 0.3 % (ref 0.0–3.0)
Basophils Absolute: 0 10*3/uL (ref 0.0–0.1)
EOS PCT: 1.8 % (ref 0.0–5.0)
Eosinophils Absolute: 0.1 10*3/uL (ref 0.0–0.7)
HEMATOCRIT: 42.4 % (ref 39.0–52.0)
Hemoglobin: 14.7 g/dL (ref 13.0–17.0)
LYMPHS ABS: 2.3 10*3/uL (ref 0.7–4.0)
Lymphocytes Relative: 29.4 % (ref 12.0–46.0)
MCHC: 34.5 g/dL (ref 30.0–36.0)
MCV: 93.8 fl (ref 78.0–100.0)
Monocytes Absolute: 0.5 10*3/uL (ref 0.1–1.0)
Monocytes Relative: 6.7 % (ref 3.0–12.0)
Neutro Abs: 4.8 10*3/uL (ref 1.4–7.7)
Neutrophils Relative %: 61.8 % (ref 43.0–77.0)
Platelets: 164 10*3/uL (ref 150.0–400.0)
RBC: 4.53 Mil/uL (ref 4.22–5.81)
RDW: 13.7 % (ref 11.5–15.5)
WBC: 7.8 10*3/uL (ref 4.0–10.5)

## 2015-04-05 LAB — LIPID PANEL
CHOL/HDL RATIO: 3
Cholesterol: 157 mg/dL (ref 0–200)
HDL: 47 mg/dL (ref >39.00)
LDL CALC: 75 mg/dL (ref 0–99)
NONHDL: 110
TRIGLYCERIDES: 174 mg/dL — AB (ref 0.0–149.0)
VLDL: 34.8 mg/dL (ref 0.0–40.0)

## 2015-04-05 LAB — HEPATIC FUNCTION PANEL
ALT: 41 U/L (ref 0–53)
AST: 26 U/L (ref 0–37)
Albumin: 4.1 g/dL (ref 3.5–5.2)
Alkaline Phosphatase: 76 U/L (ref 39–117)
BILIRUBIN TOTAL: 0.8 mg/dL (ref 0.2–1.2)
Bilirubin, Direct: 0.1 mg/dL (ref 0.0–0.3)
TOTAL PROTEIN: 7.8 g/dL (ref 6.0–8.3)

## 2015-04-05 NOTE — Patient Instructions (Signed)
Medication Instructions:  Your physician recommends that you continue on your current medications as directed. Please refer to the Current Medication list given to you today.  Labwork: Lp/bmet/hfp  Testing/Procedures: none  Follow-Up: Your physician recommends that you schedule a follow-up appointment in: 4 months with fasting labs (lp/bmet/hfp/cbc)   Any Other Special Instructions Will Be Listed Below (If Applicable).

## 2015-04-05 NOTE — Progress Notes (Signed)
Cardiology Office Note   Date:  04/05/2015   ID:  Marc [name], DOB 03-08-1941, MRN [id number]  PCP:  No PCP Per Patient  Cardiologist: Darlin Coco MD  No chief complaint on file.     History of Present Illness: Marc [name] is a 74 y.o. male who presents for a six-month follow-up office visit.  This pleasant 74 year old gentleman is seen for a six-month followup office visit. He does not speak any [place]. His son translates. He has a past history of severe hypertension with concentric LVH. He has required multiple medication to bring his blood pressure down to normal levels. He is feeling well and is having no symptoms of chest pain or shortness of breath. He is not having any side effects from the medication. His son sees to it that the patient takes his medicine. The patient also has a history of bladder problems and has been on rapaflo and reports good improvement in symptoms although he still has nocturia. He is no longer having any hematuria. His son mentions that the patient has been having some progressive short-term memory problems. The patient continues to have improved memory since starting Aricept.  Since last visit his memory problem appears to have stabilized.  He has not been having any chest pains.  He has had no further hematuria.  Lab work Today is pending.  Past Medical History  Diagnosis Date  . Hypertension     has severe LVH per echo in November 2012  . Hyperlipidemia   . BPH (benign prostatic hypertrophy)     WITH MICROSCOPIC HEMATURIA  . Hematuria     No past surgical history on file.   Current Outpatient Prescriptions  Medication Sig Dispense Refill  . aspirin 81 MG tablet Take 81 mg by mouth daily.      Marland Kitchen atorvastatin (LIPITOR) 20 MG tablet TAKE 1 TABLET BY MOUTH DAILY 90 tablet 1  . cloNIDine (CATAPRES) 0.1 MG tablet TAKE 1 TABLET BY MOUTH DAILY 90 tablet 1  . donepezil (ARICEPT) 5 MG tablet TAKE 1 TABLET BY MOUTH EVERY NIGHT AT BEDTIME 30  tablet 5  . hydrochlorothiazide (HYDRODIURIL) 25 MG tablet TAKE 1 TABLET BY MOUTH DAILY 30 tablet 6  . lisinopril (PRINIVIL,ZESTRIL) 20 MG tablet TAKE 1 TABLET BY MOUTH DAILY 90 tablet 0  . metoprolol tartrate (LOPRESSOR) 25 MG tablet TAKE 1 TABLET BY MOUTH TWICE DAILY 180 tablet 1  . RAPAFLO 8 MG CAPS capsule 8 mg daily.      No current facility-administered medications for this visit.    Allergies:   Review of patient's allergies indicates no known allergies.    Social History:  The patient  reports that he has never smoked. He does not have any smokeless tobacco history on file. He reports that he drinks alcohol. He reports that he does not use illicit drugs.   Family History:  The patient's family history is not on file. not obtainable.   ROS:  Please see the history of present illness.   Otherwise, review of systems are positive for none.   All other systems are reviewed and negative.    PHYSICAL EXAM: VS:  BP 162/80 mmHg  Pulse 60  Ht 5\' 3"  (1.6 m)  Wt 157 lb 6.4 oz (71.396 kg)  BMI 27.89 kg/m2 , BMI Body mass index is 27.89 kg/(m^2). GEN: Well nourished, well developed, in no acute distress HEENT: normal Neck: no JVD, carotid bruits, or masses Cardiac: RRR; no murmurs, rubs, or gallops,no edema  Respiratory:  clear to auscultation bilaterally, normal work of breathing GI: soft, nontender, nondistended, + BS MS: no deformity or atrophy Skin: warm and dry, no rash Neuro:  Strength and sensation are intact Psych: euthymic mood, full affect   EKG:  EKG is not ordered today.    Recent Labs: 09/29/2014: ALT 25; BUN 23; Creatinine 1.6*; Potassium 4.1; Sodium 137    Lipid Panel    Component Value Date/Time   CHOL 169 09/29/2014 0950   TRIG 197.0* 09/29/2014 0950   HDL 42.20 09/29/2014 0950   CHOLHDL 4 09/29/2014 0950   VLDL 39.4 09/29/2014 0950   LDLCALC 87 09/29/2014 0950      Wt Readings from Last 3 Encounters:  04/05/15 157 lb 6.4 oz (71.396 kg)  09/29/14  156 lb (70.761 kg)  07/26/14 158 lb (71.668 kg)      Other studies Reviewed:   ASSESSMENT AND PLAN:  1. Hypertensive cardiovascular disease.  His blood pressure is higher today.  His son states that he eats a lot of bacon.  He commended that he cut back on his bacon consumption. 2. Hypercholesterolemia 3. 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 Continue same medication. Recheck in 6 months for office visit, EKG, lipid panel hepatic function panel and basal metabolic panel   Current medicines are reviewed at length with the patient today.  The patient does not have concerns regarding medicines.  The following changes have been made:  no change  Labs/ tests ordered today include:   Orders Placed This Encounter  Procedures  . Lipid panel  . Hepatic function panel  . Basic metabolic panel  . CBC with Differential/Platelet   Disposition: Return in 6 months for follow-up office visit EKG lipid panel hepatic function panel and basal metabolic panel.  Continue current medication  Signed, Darlin Coco MD 04/05/2015 8:51 AM    [facility] [place], [place], [place]  [address] Phone: [phone]; Fax: [phone]

## 2015-04-05 NOTE — Progress Notes (Signed)
Quick Note:  Please report to patient. The recent labs are stable. Continue same medication and careful diet. ______ 

## 2015-04-06 ENCOUNTER — Other Ambulatory Visit: Payer: Self-pay | Admitting: Cardiology

## 2015-04-06 DIAGNOSIS — R413 Other amnesia: Secondary | ICD-10-CM

## 2015-05-03 ENCOUNTER — Other Ambulatory Visit: Payer: Self-pay | Admitting: Cardiology

## 2015-05-18 ENCOUNTER — Other Ambulatory Visit: Payer: Self-pay | Admitting: Cardiology

## 2015-07-04 ENCOUNTER — Other Ambulatory Visit: Payer: Self-pay | Admitting: Cardiology

## 2015-07-06 ENCOUNTER — Other Ambulatory Visit: Payer: Self-pay | Admitting: Cardiology

## 2015-08-14 ENCOUNTER — Other Ambulatory Visit: Payer: Self-pay | Admitting: Cardiology

## 2015-08-26 ENCOUNTER — Ambulatory Visit: Payer: Medicare HMO | Admitting: Cardiology

## 2015-10-02 ENCOUNTER — Other Ambulatory Visit: Payer: Self-pay | Admitting: Cardiology

## 2015-10-05 ENCOUNTER — Other Ambulatory Visit: Payer: Self-pay

## 2015-10-05 MED ORDER — DONEPEZIL HCL 5 MG PO TABS: 5.0000 mg | ORAL_TABLET | Freq: Every day | ORAL | Status: DC

## 2015-10-24 ENCOUNTER — Other Ambulatory Visit: Payer: Self-pay | Admitting: Cardiology

## 2015-10-28 ENCOUNTER — Ambulatory Visit: Payer: Medicare HMO | Admitting: Cardiology

## 2015-10-28 ENCOUNTER — Ambulatory Visit (INDEPENDENT_AMBULATORY_CARE_PROVIDER_SITE_OTHER): Payer: Medicare HMO | Admitting: Cardiology

## 2015-10-28 ENCOUNTER — Encounter: Payer: Self-pay | Admitting: Cardiology

## 2015-10-28 VITALS — BP 140/80 | HR 74 | Ht 63.0 in | Wt 160.0 lb

## 2015-10-28 DIAGNOSIS — I10 Essential (primary) hypertension: Secondary | ICD-10-CM | POA: Diagnosis not present

## 2015-10-28 DIAGNOSIS — Z79899 Other long term (current) drug therapy: Secondary | ICD-10-CM

## 2015-10-28 LAB — BASIC METABOLIC PANEL
BUN: 24 mg/dL (ref 7–25)
CO2: 24 mmol/L (ref 20–31)
Calcium: 9.2 mg/dL (ref 8.6–10.3)
Chloride: 101 mmol/L (ref 98–110)
Creat: 1.39 mg/dL — ABNORMAL HIGH (ref 0.70–1.18)
GLUCOSE: 113 mg/dL — AB (ref 65–99)
POTASSIUM: 4.5 mmol/L (ref 3.5–5.3)
Sodium: 137 mmol/L (ref 135–146)

## 2015-10-28 MED ORDER — HYDROCHLOROTHIAZIDE 25 MG PO TABS: 25.0000 mg | ORAL_TABLET | Freq: Every day | ORAL | Status: DC

## 2015-10-28 MED ORDER — SILODOSIN 8 MG PO CAPS: 8.0000 mg | ORAL_CAPSULE | Freq: Every day | ORAL | Status: DC

## 2015-10-28 MED ORDER — LISINOPRIL 20 MG PO TABS: 20.0000 mg | ORAL_TABLET | Freq: Every day | ORAL | Status: DC

## 2015-10-28 MED ORDER — DONEPEZIL HCL 5 MG PO TABS: 5.0000 mg | ORAL_TABLET | Freq: Every day | ORAL | Status: DC

## 2015-10-28 MED ORDER — CLONIDINE HCL 0.1 MG PO TABS: 0.1000 mg | ORAL_TABLET | Freq: Every day | ORAL | Status: DC

## 2015-10-28 MED ORDER — METOPROLOL TARTRATE 25 MG PO TABS: 25.0000 mg | ORAL_TABLET | Freq: Two times a day (BID) | ORAL | Status: DC

## 2015-10-28 MED ORDER — ATORVASTATIN CALCIUM 20 MG PO TABS: 20.0000 mg | ORAL_TABLET | Freq: Every day | ORAL | Status: DC

## 2015-10-28 NOTE — Patient Instructions (Addendum)
Medication Instructions:  Your physician recommends that you continue on your current medications as directed. Please refer to the Current Medication list given to you today.  Labwork: Your physician recommends that you have BMET today. Your physician recommends that you return for lab work in: 6 months fasting lipid and hepatic panel. Nothing to eat or drink after midnight on night before lab work.   Testing/Procedures: NONE  Follow-Up: Your physician wants you to follow-up in:6 month with N[facility]. You will receive a reminder letter in the mail two months in advance. If you don't receive a letter, please call our office to schedule the follow-up appointment.    If you need a refill on your cardiac medications before your next appointment, please call your pharmacy.

## 2015-10-28 NOTE — Progress Notes (Signed)
10/28/2015 Marc [name]   [birth year]  [id number]  Primary Physician No PCP Per Patient Primary Cardiologist: Dr. Mare Ferrari   Reason for Visit/CC: 6 month F/u for HTN  HPI:  The patient is a 74 year old male, followed by Dr. Mare Ferrari, who presents to clinic today for routine follow-up for hypertension. He does not speak any [place]. His son translates. He has had difficult to control hypertension and is on multiple agents including clonidine, hydrochlorothiazide, lisinopril and Metroprolol. He has no known history of CAD. Echocardiogram 10/02/2011 demonstrated normal  left ventricle systolic function with an estimated ejection fraction of 65-70%.  Grade 1 diastolic dysfunction was noted. He was noted to have severe left ventricular hypertrophy. No significant valvular abnormalities were noted. Dr. Mare Ferrari also follows his lipid profile. Most recent assessment was May 2016. LDL was controlled at 75 mg/dL. However triglycerides were abnormal at 174 mg/dL. HDL was excellent at 47 mg/dL.  Dr. Mare Ferrari recommended continuation of Lipitor along with diet modification. Over the years he has also developed some progressive short-term memory problems. He is now on Aricept and this is improved.  He is accompanied to clinic today by his son. He denies any issues since his last office visit. No reports of chest pain, dyspnea, syncope/near-syncope, palpitations, weight gain or edema. BP is decent at 140/80.  His EKG shows NSR w/o any ischemic abnormalities. HR is 74 bpm. He reports full medication compliance. He eats a low-sodium diet. No tobacco use.   Current Outpatient Prescriptions  Medication Sig Dispense Refill  . aspirin 81 MG tablet Take 81 mg by mouth daily.      Marland Kitchen atorvastatin (LIPITOR) 20 MG tablet TAKE 1 TABLET BY MOUTH DAILY 90 tablet 1  . cloNIDine (CATAPRES) 0.1 MG tablet TAKE 1 TABLET BY MOUTH DAILY 90 tablet 0  . donepezil (ARICEPT) 5 MG tablet Take 1 tablet (5 mg total) by mouth at  bedtime. 90 tablet 1  . hydrochlorothiazide (HYDRODIURIL) 25 MG tablet TAKE 1 TABLET BY MOUTH DAILY 90 tablet 1  . lisinopril (PRINIVIL,ZESTRIL) 20 MG tablet TAKE 1 TABLET BY MOUTH DAILY 90 tablet 1  . metoprolol tartrate (LOPRESSOR) 25 MG tablet TAKE 1 TABLET BY MOUTH TWICE DAILY 180 tablet 1  . metoprolol tartrate (LOPRESSOR) 25 MG tablet TAKE 1 TABLET BY MOUTH TWICE DAILY 180 tablet 0  . RAPAFLO 8 MG CAPS capsule TAKE 1 CAPSULE BY MOUTH EVERY DAY 90 capsule 0   No current facility-administered medications for this visit.    No Known Allergies  Social History   Social History  . Marital Status: Married    Spouse Name: N/A  . Number of Children: N/A  . Years of Education: N/A   Occupational History  . Not on file.   Social History Main Topics  . Smoking status: Never Smoker   . Smokeless tobacco: Not on file  . Alcohol Use: Yes     Comment: OCCASSIONAL  . Drug Use: No  . Sexual Activity: Not on file   Other Topics Concern  . Not on file   Social History Narrative     Review of Systems: General: negative for chills, fever, night sweats or weight changes.  Cardiovascular: negative for chest pain, dyspnea on exertion, edema, orthopnea, palpitations, paroxysmal nocturnal dyspnea or shortness of breath Dermatological: negative for rash Respiratory: negative for cough or wheezing Urologic: negative for hematuria Abdominal: negative for nausea, vomiting, diarrhea, bright red blood per rectum, melena, or hematemesis Neurologic: negative for visual changes, syncope, or dizziness All  other systems reviewed and are otherwise negative except as noted above.    Blood pressure 140/80, pulse 74, height 5\' 3"  (1.6 m), weight 160 lb (72.576 kg).  General appearance: alert, cooperative and no distress Neck: no carotid bruit and no JVD Lungs: clear to auscultation bilaterally Heart: regular rate and rhythm, S1, S2 normal, no murmur, click, rub or gallop Extremities: no  LEE Pulses: 2+ and symmetric Skin: warm and dry Neurologic: Grossly normal  EKG NSR. No ischemia. 74 bpm.   ASSESSMENT AND PLAN:   1. HTN:  Well controlled at 140/80. Denies CP, dyspnea, and no edema. Continue current regimen, including clonidine, hydrochlorothiazide, lisinopril and Metoprolol. Will check BMP for medication monitoring. Continue low sodium/ heart healthy diet.   2. DLD:  Last Lipid assessment was 03/2015. LDL was controlled at 75 mg/dL. However triglycerides were abnormal at 174 mg/dL. HDL was excellent at 47 mg/dL. Continue Lipitor and low fat diet. Repeat fasting lipid profile in 6 months + HFTs.   3. Age Related Dementia: On Aricept. Followed by PCP.  PLAN  follow-up with M.D. in 6 months. He will need to establish care with a new cardiologist given Dr. Mare Ferrari is anticipated retirement.  Lyda Jester PA-C 10/28/2015 8:19 AM

## 2016-02-29 DIAGNOSIS — I10 Essential (primary) hypertension: Secondary | ICD-10-CM | POA: Diagnosis not present

## 2016-02-29 DIAGNOSIS — Z0000 Encounter for general adult medical examination without abnormal findings: Secondary | ICD-10-CM | POA: Diagnosis not present

## 2016-02-29 DIAGNOSIS — Z23 Encounter for immunization: Secondary | ICD-10-CM | POA: Diagnosis not present

## 2016-02-29 DIAGNOSIS — E7800 Pure hypercholesterolemia, unspecified: Secondary | ICD-10-CM | POA: Diagnosis not present

## 2016-04-06 ENCOUNTER — Other Ambulatory Visit: Payer: Self-pay

## 2016-04-10 ENCOUNTER — Other Ambulatory Visit: Payer: Self-pay

## 2016-04-10 MED ORDER — ATORVASTATIN CALCIUM 20 MG PO TABS: 20.0000 mg | ORAL_TABLET | Freq: Every day | ORAL | Status: DC

## 2016-04-10 MED ORDER — LISINOPRIL 20 MG PO TABS: 20.0000 mg | ORAL_TABLET | Freq: Every day | ORAL | Status: DC

## 2016-05-20 ENCOUNTER — Other Ambulatory Visit: Payer: Self-pay | Admitting: Cardiology

## 2016-05-22 NOTE — Telephone Encounter (Signed)
metoprolol tartrate (LOPRESSOR) 25 MG tablet  Medication   Date: 10/28/2015  Department: [address] Office  Ordering/Authorizing: Consuelo Pandy, PA-C      Order Providers    Prescribing Provider Encounter Provider   Brittainy Erie Noe, PA-C Brittainy Erie Noe, PA-C    Medication Detail      Disp Refills Start End     metoprolol tartrate (LOPRESSOR) 25 MG tablet 180 tablet 3 10/28/2015     Sig - Route: Take 1 tablet (25 mg total) by mouth 2 (two) times daily. - Oral    E-Prescribing Status: Receipt confirmed by pharmacy (10/28/2015 8:42 AM EST)     Pharmacy    [facility] [id number] - [place], [place] - [address] AT [address]

## 2016-07-11 ENCOUNTER — Other Ambulatory Visit: Payer: Self-pay | Admitting: Cardiology

## 2016-07-12 NOTE — Telephone Encounter (Signed)
REFILL 

## 2016-08-06 DIAGNOSIS — Z23 Encounter for immunization: Secondary | ICD-10-CM | POA: Diagnosis not present

## 2016-09-30 ENCOUNTER — Other Ambulatory Visit: Payer: Self-pay | Admitting: Cardiology

## 2016-10-01 ENCOUNTER — Other Ambulatory Visit: Payer: Self-pay | Admitting: Cardiology

## 2016-10-03 ENCOUNTER — Other Ambulatory Visit: Payer: Self-pay | Admitting: *Deleted

## 2016-10-03 MED ORDER — DONEPEZIL HCL 5 MG PO TABS: 5.0000 mg | ORAL_TABLET | Freq: Every day | ORAL | 0 refills | Status: DC

## 2016-10-05 ENCOUNTER — Other Ambulatory Visit: Payer: Self-pay

## 2016-10-05 ENCOUNTER — Other Ambulatory Visit: Payer: Self-pay | Admitting: Cardiology

## 2016-10-05 MED ORDER — METOPROLOL TARTRATE 25 MG PO TABS: ORAL_TABLET | ORAL | 0 refills | Status: DC

## 2016-10-08 ENCOUNTER — Other Ambulatory Visit: Payer: Self-pay

## 2016-11-05 ENCOUNTER — Other Ambulatory Visit: Payer: Self-pay | Admitting: Cardiology

## 2016-11-06 ENCOUNTER — Other Ambulatory Visit: Payer: Self-pay | Admitting: Cardiology

## 2016-11-06 NOTE — Telephone Encounter (Signed)
REFILL 

## 2016-12-09 ENCOUNTER — Other Ambulatory Visit: Payer: Self-pay | Admitting: Cardiology

## 2016-12-10 NOTE — Telephone Encounter (Signed)
Lopressor refilled #180 w/0 refills 11/06/16

## 2016-12-27 ENCOUNTER — Other Ambulatory Visit: Payer: Self-pay | Admitting: Cardiology

## 2017-01-02 ENCOUNTER — Other Ambulatory Visit: Payer: Self-pay | Admitting: Cardiology

## 2017-01-02 MED ORDER — SILODOSIN 8 MG PO CAPS: 8.0000 mg | ORAL_CAPSULE | Freq: Every day | ORAL | 0 refills | Status: DC

## 2017-01-02 MED ORDER — CLONIDINE HCL 0.1 MG PO TABS: 0.1000 mg | ORAL_TABLET | Freq: Every day | ORAL | 0 refills | Status: DC

## 2017-01-14 ENCOUNTER — Encounter: Payer: Self-pay | Admitting: Cardiology

## 2017-01-24 ENCOUNTER — Ambulatory Visit (INDEPENDENT_AMBULATORY_CARE_PROVIDER_SITE_OTHER): Payer: Medicare HMO | Admitting: Cardiology

## 2017-01-24 ENCOUNTER — Encounter: Payer: Self-pay | Admitting: Cardiology

## 2017-01-24 VITALS — BP 138/80 | HR 77 | Ht 63.0 in | Wt 162.2 lb

## 2017-01-24 DIAGNOSIS — E785 Hyperlipidemia, unspecified: Secondary | ICD-10-CM

## 2017-01-24 DIAGNOSIS — Z79899 Other long term (current) drug therapy: Secondary | ICD-10-CM | POA: Diagnosis not present

## 2017-01-24 DIAGNOSIS — I10 Essential (primary) hypertension: Secondary | ICD-10-CM | POA: Diagnosis not present

## 2017-01-24 LAB — COMPREHENSIVE METABOLIC PANEL
A/G RATIO: 2 (ref 1.2–2.2)
ALBUMIN: 4.8 g/dL (ref 3.5–4.8)
ALT: 22 IU/L (ref 0–44)
AST: 17 IU/L (ref 0–40)
Alkaline Phosphatase: 88 IU/L (ref 39–117)
BUN / CREAT RATIO: 16 (ref 10–24)
BUN: 23 mg/dL (ref 8–27)
Bilirubin Total: 0.6 mg/dL (ref 0.0–1.2)
CALCIUM: 9.4 mg/dL (ref 8.6–10.2)
CO2: 23 mmol/L (ref 18–29)
CREATININE: 1.42 mg/dL — AB (ref 0.76–1.27)
Chloride: 98 mmol/L (ref 96–106)
GFR, EST AFRICAN AMERICAN: 55 mL/min/{1.73_m2} — AB (ref >59)
GFR, EST NON AFRICAN AMERICAN: 48 mL/min/{1.73_m2} — AB (ref >59)
GLOBULIN, TOTAL: 2.4 g/dL (ref 1.5–4.5)
Glucose: 101 mg/dL — ABNORMAL HIGH (ref 65–99)
POTASSIUM: 4 mmol/L (ref 3.5–5.2)
SODIUM: 140 mmol/L (ref 134–144)
TOTAL PROTEIN: 7.2 g/dL (ref 6.0–8.5)

## 2017-01-24 LAB — LIPID PANEL
CHOLESTEROL TOTAL: 179 mg/dL (ref 100–199)
Chol/HDL Ratio: 4.1 ratio units (ref 0.0–5.0)
HDL: 44 mg/dL (ref >39)
LDL Calculated: 88 mg/dL (ref 0–99)
Triglycerides: 234 mg/dL — ABNORMAL HIGH (ref 0–149)
VLDL CHOLESTEROL CAL: 47 mg/dL — AB (ref 5–40)

## 2017-01-24 MED ORDER — ATORVASTATIN CALCIUM 20 MG PO TABS: 20.0000 mg | ORAL_TABLET | Freq: Every day | ORAL | 4 refills | Status: DC

## 2017-01-24 MED ORDER — HYDROCHLOROTHIAZIDE 25 MG PO TABS: 25.0000 mg | ORAL_TABLET | Freq: Every day | ORAL | 4 refills | Status: DC

## 2017-01-24 MED ORDER — LISINOPRIL 20 MG PO TABS
20.0000 mg | ORAL_TABLET | Freq: Every day | ORAL | 4 refills | Status: DC
Start: 2017-01-24 — End: 2018-01-06

## 2017-01-24 MED ORDER — CLONIDINE HCL 0.1 MG PO TABS: 0.1000 mg | ORAL_TABLET | Freq: Every day | ORAL | 4 refills | Status: DC

## 2017-01-24 NOTE — Patient Instructions (Signed)
Medication Instructions:   Your physician recommends that you continue on your current medications as directed. Please refer to the Current Medication list given to you today.    If you need a refill on your cardiac medications before your next appointment, please call your pharmacy.  Labwork: CMET AND FASTING LIPID TODAY   Testing/Procedures: NONE ORDERED  TODAY    Follow-Up: Your physician wants you to follow-up in: [place] [address] END  You will receive a reminder letter in the mail two months in advance. If you don't receive a letter, please call our office to schedule the follow-up appointment.     Any Other Special Instructions Will Be Listed Below (If Applicable).

## 2017-01-24 NOTE — Progress Notes (Signed)
01/24/2017 Marc [name]   06-05-1941  [id number]  Primary Physician No PCP Per Patient Primary Cardiologist: Dr. Mare Ferrari    Reason for Visit/CC: Yearly F/u for HTN and HLD  HPI:  [name] presents back to clinic for his 1 year f/u. He was being followed by Dr. Mare Ferrari prior to his retirement for HTN and HLD. He is a 76 y/o male. He does not speak [place]. His son translates for him. He has had difficult to control hypertension in the past and is on multiple agents including clonidine, hydrochlorothiazide, lisinopril and Metroprolol. He has no known history of CAD. Echocardiogram 10/02/2011 demonstrated normal  left ventricle systolic function with an estimated ejection fraction of 65-70%.  Grade 1 diastolic dysfunction was noted. He was noted to have severe left ventricular hypertrophy. No significant valvular abnormalities were noted. Dr. Mare Ferrari was also following his lipid profile. Most recent assessment was May 2016. LDL was controlled at 75 mg/dL. However triglycerides were abnormal at 174 mg/dL. HDL was excellent at 47 mg/dL.  Dr. Mare Ferrari recommended continuation of Lipitor along with diet modification. He was ordered to get a repeat lipid panel in Dec 2016 but failed to do so. Over the years, he has also developed some progressive short-term memory problems. He is now on Aricept.   He is accompanied to clinic today by his son, who assist with translation. He denies any cardiac issues since his last office visit. No reports of chest pain, dyspnea, syncope/near-syncope, palpitations, weight gain or edema. His son reports his bigest medical problem at this point is his memory. His son and wife assist him with his medications. BP is controlled today at 138/80. His EKG shows NSR w/o any ischemic abnormalities. HR is 77 bpm. No tobacco use. He ambulates well. No issues with falls.    Current Meds  Medication Sig  . aspirin 81 MG tablet Take 81 mg by mouth daily.    Marland Kitchen atorvastatin  (LIPITOR) 20 MG tablet Take 1 tablet (20 mg total) by mouth daily.  . cloNIDine (CATAPRES) 0.1 MG tablet Take 1 tablet (0.1 mg total) by mouth daily.  Marland Kitchen donepezil (ARICEPT) 5 MG tablet Take 1 tablet (5 mg total) by mouth at bedtime. NEED OV.  . hydrochlorothiazide (HYDRODIURIL) 25 MG tablet TAKE 1 TABLET BY MOUTH DAILY  . lisinopril (PRINIVIL,ZESTRIL) 20 MG tablet Take 1 tablet (20 mg total) by mouth daily.  . metoprolol tartrate (LOPRESSOR) 25 MG tablet Take 1 tablet (25 mg total) by mouth 2 (two) times daily. NEED OV.  . silodosin (RAPAFLO) 8 MG CAPS capsule Take 1 capsule (8 mg total) by mouth daily.   No Known Allergies Past Medical History:  Diagnosis Date  . BPH (benign prostatic hypertrophy)    WITH MICROSCOPIC HEMATURIA  . Hematuria   . Hyperlipidemia   . Hypertension    has severe LVH per echo in November 2012   Family History  Problem Relation Age of Onset  . Family history unknown: Yes   No past surgical history on file. Social History   Social History  . Marital status: Married    Spouse name: N/A  . Number of children: N/A  . Years of education: N/A   Occupational History  . Not on file.   Social History Main Topics  . Smoking status: Never Smoker  . Smokeless tobacco: Never Used  . Alcohol use Yes     Comment: OCCASSIONAL  . Drug use: No  . Sexual activity: Not on file  Other Topics Concern  . Not on file   Social History Narrative  . No narrative on file     Review of Systems: General: negative for chills, fever, night sweats or weight changes.  Cardiovascular: negative for chest pain, dyspnea on exertion, edema, orthopnea, palpitations, paroxysmal nocturnal dyspnea or shortness of breath Dermatological: negative for rash Respiratory: negative for cough or wheezing Urologic: negative for hematuria Abdominal: negative for nausea, vomiting, diarrhea, bright red blood per rectum, melena, or hematemesis Neurologic: negative for visual changes,  syncope, or dizziness All other systems reviewed and are otherwise negative except as noted above.   Physical Exam:  Blood pressure 138/80, pulse 77, height 5\' 3"  (1.6 m), weight 162 lb 4 oz (73.6 kg), SpO2 94 %.  General appearance: alert, cooperative and no distress Neck: no carotid bruit and no JVD Lungs: clear to auscultation bilaterally Heart: regular rate and rhythm, S1, S2 normal, no murmur, click, rub or gallop Extremities: extremities normal, atraumatic, no cyanosis or edema Pulses: 2+ and symmetric Skin: Skin color, texture, turgor normal. No rashes or lesions Neurologic: Grossly normal  EKG NSR. No ischemia. 77 bpm.   ASSESSMENT AND PLAN:   1. HTN:  Well controlled at 138/80. He denies CP, dyspnea, and no LE edema. Continue current regimen, including clonidine, hydrochlorothiazide, lisinopril and Metoprolol. Will check CMP for medication monitoring. Continue low sodium/ heart healthy diet.   2. DLD:  Last Lipid assessment was 03/2015. LDL was controlled at 75 mg/dL. However triglycerides were abnormal at 174 mg/dL. HDL was excellent at 47 mg/dL. He was ordered to get a repeat lipid panel 10/2015 but failed to do so. He is fasting today. We will check fasting lipids and HFTs. Continue Lipitor and low fat diet.   3. Age Related Dementia: On Aricept. Followed by PCP.  PLAN  F/u in 1 year for repeat assessment. He will need a new cardiologist. Will set up with Dr. Saunders Revel.   Lyda Jester PA-C 01/24/2017 8:20 AM

## 2017-01-30 ENCOUNTER — Telehealth: Payer: Self-pay | Admitting: Cardiology

## 2017-01-30 DIAGNOSIS — Z79899 Other long term (current) drug therapy: Secondary | ICD-10-CM

## 2017-01-30 NOTE — Telephone Encounter (Signed)
New message ° ° ° °Pt is returning call to Jennifer about labs.  °

## 2017-01-30 NOTE — Telephone Encounter (Signed)
-----   Message from Consuelo Pandy, [place] sent at 01/30/2017  1:41 PM EST ----- Renal function is stable. Liver function ok. LDL is at goal however his TGs are high. Advise that he eat a low fat diet and consider taking a daily OTC fish oil supplement. Recheck fasting lipid panel in 3 months after dietary changes.

## 2017-01-31 ENCOUNTER — Other Ambulatory Visit: Payer: Self-pay

## 2017-01-31 MED ORDER — SILODOSIN 8 MG PO CAPS: 8.0000 mg | ORAL_CAPSULE | Freq: Every day | ORAL | 5 refills | Status: DC

## 2017-03-08 ENCOUNTER — Other Ambulatory Visit: Payer: Self-pay | Admitting: Cardiology

## 2017-03-08 NOTE — Telephone Encounter (Signed)
REFILL 

## 2017-03-26 ENCOUNTER — Other Ambulatory Visit: Payer: Self-pay | Admitting: Cardiovascular Disease

## 2017-04-12 ENCOUNTER — Other Ambulatory Visit: Payer: Self-pay | Admitting: Cardiovascular Disease

## 2017-04-12 DIAGNOSIS — N400 Enlarged prostate without lower urinary tract symptoms: Secondary | ICD-10-CM | POA: Diagnosis not present

## 2017-04-12 DIAGNOSIS — Z7982 Long term (current) use of aspirin: Secondary | ICD-10-CM | POA: Diagnosis not present

## 2017-04-12 DIAGNOSIS — Z6829 Body mass index (BMI) 29.0-29.9, adult: Secondary | ICD-10-CM | POA: Diagnosis not present

## 2017-04-12 DIAGNOSIS — Z79899 Other long term (current) drug therapy: Secondary | ICD-10-CM | POA: Diagnosis not present

## 2017-04-12 DIAGNOSIS — I10 Essential (primary) hypertension: Secondary | ICD-10-CM | POA: Diagnosis not present

## 2017-04-12 DIAGNOSIS — Z87891 Personal history of nicotine dependence: Secondary | ICD-10-CM | POA: Diagnosis not present

## 2017-04-12 DIAGNOSIS — R69 Illness, unspecified: Secondary | ICD-10-CM | POA: Diagnosis not present

## 2017-04-12 DIAGNOSIS — Z0000 Encounter for general adult medical examination without abnormal findings: Secondary | ICD-10-CM | POA: Diagnosis not present

## 2017-04-12 DIAGNOSIS — E785 Hyperlipidemia, unspecified: Secondary | ICD-10-CM | POA: Diagnosis not present

## 2017-04-12 NOTE — Telephone Encounter (Signed)
Medication Detail    Disp Refills Start End   lisinopril (PRINIVIL,ZESTRIL) 20 MG tablet 90 tablet 4 01/24/2017    Sig - Route: Take 1 tablet (20 mg total) by mouth daily. - Oral   E-Prescribing Status: Receipt confirmed by pharmacy (01/24/2017 8:36 AM EST)   Pharmacy   [id number] - [place], [place] - [address]

## 2017-04-30 ENCOUNTER — Other Ambulatory Visit: Payer: Self-pay | Admitting: Cardiology

## 2017-08-12 DIAGNOSIS — R69 Illness, unspecified: Secondary | ICD-10-CM | POA: Diagnosis not present

## 2017-11-26 ENCOUNTER — Other Ambulatory Visit: Payer: Self-pay | Admitting: Cardiology

## 2017-11-28 NOTE — Telephone Encounter (Signed)
I would ask Marc [name], it doesn't look like Dr End has seen this patient yet, probably should be refilled by PCP.

## 2017-12-12 ENCOUNTER — Other Ambulatory Visit: Payer: Self-pay | Admitting: *Deleted

## 2017-12-12 MED ORDER — SILODOSIN 8 MG PO CAPS: 8.0000 mg | ORAL_CAPSULE | Freq: Every day | ORAL | 0 refills | Status: DC

## 2018-01-03 ENCOUNTER — Telehealth: Payer: Self-pay | Admitting: Internal Medicine

## 2018-01-03 NOTE — Telephone Encounter (Signed)
New message    Patient son Elberta Fortis calling to report BP. Please call [phone]  Pt c/o BP issue: STAT if pt c/o blurred vision, one-sided weakness or slurred speech  1. What are your last 5 BP readings? 77/49 pulse 74  2. Are you having any other symptoms (ex. Dizziness, headache, blurred vision, passed out)? dizziness  3. What is your BP issue? Low BP

## 2018-01-03 NOTE — Telephone Encounter (Signed)
Checked up on patient and he is doing fine, BP 119/70..  I informed them to hold his evening metoprolol (Friday) and to continuously check the BP over the weekend at midday and keep a record.  I will call him Monday morning for an update.

## 2018-01-03 NOTE — Telephone Encounter (Signed)
After patient's son called at 10:50 am, the patient's BP was 116/64 and then patient went to lie down.  He apparently had not yet taken his morning BP medications.  I informed the son to check BP throughout the day and I will call them at 3pm to get an update, if remains low, he should call us prior.

## 2018-01-03 NOTE — Telephone Encounter (Signed)
Spoke with patient's son Elberta Fortis) who said that his father's BP was running low recently.  Last night they checked it because he was dizzy and it read 77/49.  This morning they recorded it at 90/? (did not remember diastolic #).  They checked it most recently after calling

## 2018-01-06 DIAGNOSIS — R5381 Other malaise: Secondary | ICD-10-CM | POA: Diagnosis not present

## 2018-01-06 DIAGNOSIS — R5383 Other fatigue: Secondary | ICD-10-CM | POA: Diagnosis not present

## 2018-01-06 MED ORDER — LISINOPRIL 10 MG PO TABS: 10.0000 mg | ORAL_TABLET | Freq: Every day | ORAL | 3 refills | Status: DC

## 2018-01-06 NOTE — Telephone Encounter (Signed)
Spoke with patient's son Marc [name]) about weekend follow up of his father.  He said he did fine with a BP of 105/77 and 109/69..   I told him to resume his medications and we will follow up with him later today.  There is a concern whether the BP medications he is taking would be causing significant hypotension.  Is there anything we should change?  Please advise.

## 2018-01-06 NOTE — Telephone Encounter (Signed)
We can cut back on his lisinopril. Reduce from 20 mg to 10 mg daily and monitor at home. Goal BP is <130/80. Thanks.

## 2018-01-06 NOTE — Telephone Encounter (Signed)
Spoke with patient's son Marc [name]) and informed him of Brittainy's recommendation: Decrease Lisinopril from 20mg  to 10mg  and maintain BP <13/80.Marland Kitchen    He verbalized understanding.

## 2018-01-06 NOTE — Telephone Encounter (Signed)
Lpm and I will call them back.

## 2018-01-07 ENCOUNTER — Emergency Department (HOSPITAL_COMMUNITY): Payer: Medicare HMO

## 2018-01-07 ENCOUNTER — Encounter (HOSPITAL_COMMUNITY): Payer: Self-pay | Admitting: Emergency Medicine

## 2018-01-07 ENCOUNTER — Other Ambulatory Visit: Payer: Self-pay

## 2018-01-07 ENCOUNTER — Inpatient Hospital Stay (HOSPITAL_COMMUNITY)
Admission: EM | Admit: 2018-01-07 | Discharge: 2018-01-10 | DRG: 378 | Disposition: A | Discharge status: Home / Self Care | Payer: Medicare HMO | Attending: Internal Medicine | Admitting: Internal Medicine

## 2018-01-07 DIAGNOSIS — D649 Anemia, unspecified: Secondary | ICD-10-CM | POA: Diagnosis present

## 2018-01-07 DIAGNOSIS — K648 Other hemorrhoids: Secondary | ICD-10-CM | POA: Diagnosis present

## 2018-01-07 DIAGNOSIS — I951 Orthostatic hypotension: Secondary | ICD-10-CM | POA: Diagnosis present

## 2018-01-07 DIAGNOSIS — K209 Esophagitis, unspecified: Secondary | ICD-10-CM | POA: Diagnosis present

## 2018-01-07 DIAGNOSIS — N179 Acute kidney failure, unspecified: Secondary | ICD-10-CM | POA: Diagnosis not present

## 2018-01-07 DIAGNOSIS — Z79899 Other long term (current) drug therapy: Secondary | ICD-10-CM | POA: Diagnosis not present

## 2018-01-07 DIAGNOSIS — N400 Enlarged prostate without lower urinary tract symptoms: Secondary | ICD-10-CM | POA: Diagnosis not present

## 2018-01-07 DIAGNOSIS — K3189 Other diseases of stomach and duodenum: Secondary | ICD-10-CM | POA: Diagnosis not present

## 2018-01-07 DIAGNOSIS — R296 Repeated falls: Secondary | ICD-10-CM | POA: Diagnosis present

## 2018-01-07 DIAGNOSIS — D539 Nutritional anemia, unspecified: Secondary | ICD-10-CM | POA: Diagnosis present

## 2018-01-07 DIAGNOSIS — D62 Acute posthemorrhagic anemia: Secondary | ICD-10-CM | POA: Diagnosis present

## 2018-01-07 DIAGNOSIS — E876 Hypokalemia: Secondary | ICD-10-CM | POA: Diagnosis present

## 2018-01-07 DIAGNOSIS — F0280 Dementia in other diseases classified elsewhere without behavioral disturbance: Secondary | ICD-10-CM | POA: Diagnosis present

## 2018-01-07 DIAGNOSIS — I10 Essential (primary) hypertension: Secondary | ICD-10-CM | POA: Diagnosis present

## 2018-01-07 DIAGNOSIS — D638 Anemia in other chronic diseases classified elsewhere: Secondary | ICD-10-CM | POA: Diagnosis present

## 2018-01-07 DIAGNOSIS — R195 Other fecal abnormalities: Secondary | ICD-10-CM | POA: Diagnosis present

## 2018-01-07 DIAGNOSIS — Z7982 Long term (current) use of aspirin: Secondary | ICD-10-CM | POA: Diagnosis not present

## 2018-01-07 DIAGNOSIS — K317 Polyp of stomach and duodenum: Secondary | ICD-10-CM | POA: Diagnosis not present

## 2018-01-07 DIAGNOSIS — N183 Chronic kidney disease, stage 3 unspecified: Secondary | ICD-10-CM

## 2018-01-07 DIAGNOSIS — D122 Benign neoplasm of ascending colon: Secondary | ICD-10-CM | POA: Diagnosis not present

## 2018-01-07 DIAGNOSIS — F0390 Unspecified dementia without behavioral disturbance: Secondary | ICD-10-CM | POA: Diagnosis present

## 2018-01-07 DIAGNOSIS — E785 Hyperlipidemia, unspecified: Secondary | ICD-10-CM | POA: Diagnosis present

## 2018-01-07 DIAGNOSIS — K922 Gastrointestinal hemorrhage, unspecified: Secondary | ICD-10-CM | POA: Diagnosis not present

## 2018-01-07 DIAGNOSIS — R4182 Altered mental status, unspecified: Secondary | ICD-10-CM | POA: Diagnosis not present

## 2018-01-07 DIAGNOSIS — R69 Illness, unspecified: Secondary | ICD-10-CM | POA: Diagnosis not present

## 2018-01-07 DIAGNOSIS — D631 Anemia in chronic kidney disease: Secondary | ICD-10-CM | POA: Diagnosis not present

## 2018-01-07 DIAGNOSIS — I129 Hypertensive chronic kidney disease with stage 1 through stage 4 chronic kidney disease, or unspecified chronic kidney disease: Secondary | ICD-10-CM | POA: Diagnosis not present

## 2018-01-07 DIAGNOSIS — R413 Other amnesia: Secondary | ICD-10-CM | POA: Diagnosis not present

## 2018-01-07 LAB — CBC
HEMATOCRIT: 30.9 % — AB (ref 39.0–52.0)
Hemoglobin: 8.6 g/dL — ABNORMAL LOW (ref 13.0–17.0)
MCH: 33.2 pg (ref 26.0–34.0)
MCHC: 27.8 g/dL — ABNORMAL LOW (ref 30.0–36.0)
MCV: 119.3 fL — AB (ref 78.0–100.0)
Platelets: 201 10*3/uL (ref 150–400)
RBC: 2.59 MIL/uL — AB (ref 4.22–5.81)
RDW: 15.8 % — ABNORMAL HIGH (ref 11.5–15.5)
WBC: 10.9 10*3/uL — ABNORMAL HIGH (ref 4.0–10.5)

## 2018-01-07 LAB — COMPREHENSIVE METABOLIC PANEL
ALT: 26 U/L (ref 17–63)
AST: 31 U/L (ref 15–41)
Albumin: 3.5 g/dL (ref 3.5–5.0)
Alkaline Phosphatase: 74 U/L (ref 38–126)
Anion gap: 12 (ref 5–15)
BUN: 25 mg/dL — ABNORMAL HIGH (ref 6–20)
CHLORIDE: 102 mmol/L (ref 101–111)
CO2: 22 mmol/L (ref 22–32)
Calcium: 8.2 mg/dL — ABNORMAL LOW (ref 8.9–10.3)
Creatinine, Ser: 1.79 mg/dL — ABNORMAL HIGH (ref 0.61–1.24)
GFR calc non Af Amer: 35 mL/min — ABNORMAL LOW (ref >60)
GFR, EST AFRICAN AMERICAN: 41 mL/min — AB (ref >60)
Glucose, Bld: 147 mg/dL — ABNORMAL HIGH (ref 65–99)
POTASSIUM: 3.4 mmol/L — AB (ref 3.5–5.1)
SODIUM: 136 mmol/L (ref 135–145)
Total Bilirubin: 0.7 mg/dL (ref 0.3–1.2)
Total Protein: 6.9 g/dL (ref 6.5–8.1)

## 2018-01-07 LAB — POC OCCULT BLOOD, ED: FECAL OCCULT BLD: POSITIVE — AB

## 2018-01-07 LAB — URINALYSIS, ROUTINE W REFLEX MICROSCOPIC
Bilirubin Urine: NEGATIVE
Glucose, UA: NEGATIVE mg/dL
Hgb urine dipstick: NEGATIVE
Ketones, ur: NEGATIVE mg/dL
LEUKOCYTES UA: NEGATIVE
NITRITE: NEGATIVE
PH: 5 (ref 5.0–8.0)
Protein, ur: NEGATIVE mg/dL
SPECIFIC GRAVITY, URINE: 1.012 (ref 1.005–1.030)

## 2018-01-07 LAB — CBG MONITORING, ED: Glucose-Capillary: 153 mg/dL — ABNORMAL HIGH (ref 65–99)

## 2018-01-07 LAB — TROPONIN I

## 2018-01-07 LAB — AMMONIA: Ammonia: 34 umol/L (ref 9–35)

## 2018-01-07 MED ORDER — SODIUM CHLORIDE 0.9 % IV SOLN
8.0000 mg/h | INTRAVENOUS | Status: DC
  Administered 2018-01-07 – 2018-01-10 (×7): 8 mg/h via INTRAVENOUS
  Filled 2018-01-07 (×9): qty 80

## 2018-01-07 MED ORDER — SODIUM CHLORIDE 0.9 % IV SOLN
80.0000 mg | Freq: Once | INTRAVENOUS | Status: AC
  Administered 2018-01-07: 80 mg via INTRAVENOUS
  Filled 2018-01-07: qty 80

## 2018-01-07 MED ORDER — ATORVASTATIN CALCIUM 10 MG PO TABS
20.0000 mg | ORAL_TABLET | Freq: Every day | ORAL | Status: DC
  Administered 2018-01-08 – 2018-01-09 (×2): 20 mg via ORAL
  Filled 2018-01-07 (×2): qty 2

## 2018-01-07 MED ORDER — DONEPEZIL HCL 10 MG PO TABS
5.0000 mg | ORAL_TABLET | Freq: Every day | ORAL | Status: DC
  Administered 2018-01-07 – 2018-01-09 (×3): 5 mg via ORAL
  Filled 2018-01-07 (×3): qty 1

## 2018-01-07 MED ORDER — CLONIDINE HCL 0.1 MG PO TABS
0.1000 mg | ORAL_TABLET | Freq: Every day | ORAL | Status: DC
  Administered 2018-01-08 – 2018-01-10 (×2): 0.1 mg via ORAL
  Filled 2018-01-07 (×2): qty 1

## 2018-01-07 MED ORDER — SODIUM CHLORIDE 0.9 % IV SOLN
INTRAVENOUS | Status: DC
  Administered 2018-01-07 – 2018-01-09 (×6): via INTRAVENOUS

## 2018-01-07 MED ORDER — ACETAMINOPHEN 650 MG RE SUPP: 650.0000 mg | Freq: Four times a day (QID) | RECTAL | Status: DC | PRN

## 2018-01-07 MED ORDER — ACETAMINOPHEN 325 MG PO TABS
650.0000 mg | ORAL_TABLET | Freq: Four times a day (QID) | ORAL | Status: DC | PRN
Start: 2018-01-07 — End: 2018-01-10
  Administered 2018-01-08: 650 mg via ORAL
  Filled 2018-01-07: qty 2

## 2018-01-07 NOTE — ED Notes (Signed)
Patient transported to CT 

## 2018-01-07 NOTE — ED Notes (Signed)
ED TO INPATIENT HANDOFF REPORT  Name/Age/Gender Marc [name] 77 y.o. male  Code Status Code Status History    This patient does not have a recorded code status. Please follow your organizational policy for patients in this situation.    Advance Directive Documentation     Most Recent Value  Type of Advance Directive  Healthcare Power of Attorney, Living will  Pre-existing out of facility DNR order (yellow form or pink MOST form)  No data  "MOST" Form in Place?  No data      Home/SNF/Other Home  Chief Complaint Abnormal Labs  Level of Care/Admitting Diagnosis ED Disposition    ED Disposition Condition Comment   Admit  Hospital Area: [facility] [[id number]]  Level of Care: Med-Surg [16]  Diagnosis: Symptomatic anemia [2841324]  Admitting Physician: Patrecia Pour [phone]  Attending Physician: Patrecia Pour [phone]  Estimated length of stay: past midnight tomorrow  Certification:: I certify this patient will need inpatient services for at least 2 midnights  PT Class (Do Not Modify): Inpatient [101]  PT Acc Code (Do Not Modify): Private [1]       Medical History Past Medical History:  Diagnosis Date  . BPH (benign prostatic hypertrophy)    WITH MICROSCOPIC HEMATURIA  . Hematuria   . Hyperlipidemia   . Hypertension    has severe LVH per echo in November 2012    Allergies No Known Allergies  IV Location/Drains/Wounds Patient Lines/Drains/Airways Status   Active Line/Drains/Airways    None          Labs/Imaging Results for orders placed or performed during the hospital encounter of 01/07/18 (from the past 48 hour(s))  Comprehensive metabolic panel     Status: Abnormal   Collection Time: 01/07/18  1:39 PM  Result Value Ref Range   Sodium 136 135 - 145 mmol/L   Potassium 3.4 (L) 3.5 - 5.1 mmol/L   Chloride 102 101 - 111 mmol/L   CO2 22 22 - 32 mmol/L   Glucose, Bld 147 (H) 65 - 99 mg/dL   BUN 25 (H) 6 - 20 mg/dL   Creatinine, Ser 1.79 (H)  0.61 - 1.24 mg/dL   Calcium 8.2 (L) 8.9 - 10.3 mg/dL   Total Protein 6.9 6.5 - 8.1 g/dL   Albumin 3.5 3.5 - 5.0 g/dL   AST 31 15 - 41 U/L   ALT 26 17 - 63 U/L   Alkaline Phosphatase 74 38 - 126 U/L   Total Bilirubin 0.7 0.3 - 1.2 mg/dL   GFR calc non Af Amer 35 (L) >60 mL/min   GFR calc Af Amer 41 (L) >60 mL/min    Comment: (NOTE) The eGFR has been calculated using the CKD EPI equation. This calculation has not been validated in all clinical situations. eGFR's persistently <60 mL/min signify possible Chronic Kidney Disease.    Anion gap 12 5 - 15    Comment: Performed at [facility], [place] [address] [place], [place] [address]  CBC     Status: Abnormal   Collection Time: 01/07/18  1:39 PM  Result Value Ref Range   WBC 10.9 (H) 4.0 - 10.5 K/uL   RBC 2.59 (L) 4.22 - 5.81 MIL/uL   Hemoglobin 8.6 (L) 13.0 - 17.0 g/dL   HCT 30.9 (L) 39.0 - 52.0 %   MCV 119.3 (H) 78.0 - 100.0 fL   MCH 33.2 26.0 - 34.0 pg   MCHC 27.8 (L) 30.0 - 36.0 g/dL   RDW  15.8 (H) 11.5 - 15.5 %   Platelets 201 150 - 400 K/uL    Comment: Performed at [facility], [place] [address], [place], [place] [address]  Ammonia     Status: None   Collection Time: 01/07/18  1:43 PM  Result Value Ref Range   Ammonia 34 9 - 35 umol/L    Comment: Performed at [facility], [place] [address], [place], [place] [address]  Troponin I     Status: None   Collection Time: 01/07/18  1:43 PM  Result Value Ref Range   Troponin I <0.03 <0.03 ng/mL    Comment: Performed at [facility], [place] [address], [place], [place] [address]  CBG monitoring, ED     Status: Abnormal   Collection Time: 01/07/18  1:51 PM  Result Value Ref Range   Glucose-Capillary 153 (H) 65 - 99 mg/dL  POC occult blood, ED Provider will collect     Status: Abnormal   Collection Time: 01/07/18  2:05 PM  Result Value Ref Range   Fecal Occult Bld POSITIVE (A) NEGATIVE  Urinalysis, Routine w  reflex microscopic     Status: None   Collection Time: 01/07/18  5:14 PM  Result Value Ref Range   Color, Urine YELLOW YELLOW   APPearance CLEAR CLEAR   Specific Gravity, Urine 1.012 1.005 - 1.030   pH 5.0 5.0 - 8.0   Glucose, UA NEGATIVE NEGATIVE mg/dL   Hgb urine dipstick NEGATIVE NEGATIVE   Bilirubin Urine NEGATIVE NEGATIVE   Ketones, ur NEGATIVE NEGATIVE mg/dL   Protein, ur NEGATIVE NEGATIVE mg/dL   Nitrite NEGATIVE NEGATIVE   Leukocytes, UA NEGATIVE NEGATIVE    Comment: Performed at [facility], [place] [address] [place], [place] [address]   Dg Chest 2 View  Result Date: 01/07/2018 CLINICAL DATA:  Weakness and fatigue, memory loss for the past week. Patient found have decreased renal function and anemia. History of hypertension EXAM: CHEST  2 VIEW COMPARISON:  Chest x-ray of September 26, 2011 FINDINGS: The lungs are reasonably well inflated and clear. The heart and pulmonary vascularity are normal. There is no pleural effusion. There calcification in the wall of the thoracic aorta. There mild multilevel degenerative disc disease of the thoracic spine. IMPRESSION: There is no pneumonia, CHF, nor other acute cardiopulmonary abnormality. Thoracic aortic atherosclerosis. Electronically Signed   By: David  Martinique M.D.   On: 01/07/2018 14:22   Ct Head Wo Contrast  Result Date: 01/07/2018 CLINICAL DATA:  Altered mental status. EXAM: CT HEAD WITHOUT CONTRAST TECHNIQUE: Contiguous axial images were obtained from the base of the skull through the vertex without intravenous contrast. COMPARISON:  CT head dated September 29, 2011. FINDINGS: Brain: No evidence of acute infarction, hemorrhage, hydrocephalus, extra-axial collection or mass lesion/mass effect. Stable cerebral atrophy and chronic microvascular ischemic changes. Unchanged lacunar infarcts in the bilateral basal ganglia and right thalamus. Vascular: Atherosclerotic vascular calcification of the carotid siphons. No hyperdense  vessel. Skull: Normal. Negative for fracture or focal lesion. Sinuses/Orbits: No acute finding. Other: None. IMPRESSION: 1. No acute intracranial abnormality. Stable cerebral atrophy and chronic microvascular ischemic changes. Electronically Signed   By: Titus Dubin M.D.   On: 01/07/2018 13:36    Pending Labs Unresulted Labs (From admission, onward)   Start     Ordered   01/08/18 0500  CBC  Tomorrow morning,   R     01/07/18 1540   01/07/18 1540  Folate RBC  Once,  R     01/07/18 1539   01/07/18 1540  Vitamin B12  Once,   R     01/07/18 1539   01/07/18 1504  Iron and TIBC  Once,   R     01/07/18 1503   01/07/18 1504  Ferritin  Once,   R     01/07/18 1503   Signed and Held  Basic metabolic panel  Tomorrow morning,   R     Signed and Held   Signed and Held  Vitamin B12  (Anemia Panel (PNL))  Tomorrow morning,   R     Signed and Held   Signed and Held  Folate  (Anemia Panel (PNL))  Tomorrow morning,   R     Signed and Held   Signed and Held  Iron and TIBC  (Anemia Panel (PNL))  Tomorrow morning,   R     Signed and Held   Signed and Held  Ferritin  (Anemia Panel (PNL))  Tomorrow morning,   R     Signed and Held   Signed and Held  Reticulocytes  (Anemia Panel (PNL))  Tomorrow morning,   R     Signed and Held      Vitals/Pain Today's Vitals   01/07/18 1430 01/07/18 1600 01/07/18 1711 01/07/18 1900  BP: 121/61 (!) 128/58 (!) 154/65 (!) 119/94  Pulse: 84 81 89 77  Resp:   20 15  Temp:      TempSrc:      SpO2: 100% 100% 100% 100%    Isolation Precautions No active isolations  Medications Medications  0.9 %  sodium chloride infusion ( Intravenous New Bag/Given 01/07/18 1300)  pantoprazole (PROTONIX) 80 mg in sodium chloride 0.9 % 250 mL (0.32 mg/mL) infusion (8 mg/hr Intravenous New Bag/Given 01/07/18 1600)  pantoprazole (PROTONIX) 80 mg in sodium chloride 0.9 % 100 mL IVPB (0 mg Intravenous Stopped 01/07/18 1632)    Mobility walks

## 2018-01-07 NOTE — H&P (Signed)
History and Physical   Gautham Hewins IHK:[id number] DOB: 11-01-1941 DOA: 01/07/2018  Referring MD/NP/PA: Gilford Raid, MD, [place] PCP: Dr. Mare Ferrari, [place] Patient coming from: Home  Chief Complaint: Fatigue, abnormal labs  HPI: Jabri Blancett is a 77 y.o. male with a history of dementia, HTN, HLD who presented to the ED after being called by urgent care for abnormal labs. History is per son and EMR due to patient's dementia. His son reports more frequent falls, most recently a fall at home 2/7 without significant injury, though the patient was lightheaded, BP taken at home was 70/40, but improve to 110s/60s with time. He denied LOC, attributed it to generalized fatigue/weakness which continued, constant, worsening, until he took his father to urgent care 2/11 for evaluation (has no real PCP). He was discharged to home after labsd were drawn and were called this morning advising he go to the ED for elevated creatinine and low hemoglobin. Denies fever, chills, weight loss, chest pain, palpitations, syncope, shortness of breath, abdominal pain, nausea, vomiting, changes in bowel habits, dark tarry or red stools, change in bladder habits, hematuria, myalgias, arthralgias, and rash.   ED Course: On arrival blood pressure is low-normal, but orthostatic (141/64 > 110/59 with standing), creatinine 1.79, BUN 25, Hgb 8.6 (from 9.2). +FOBT. Troponin negative, WBC 10.9, platelets 201. K is 3.4. GI consulted, recommended proceeding with EGD 2/13. Hospitalists called to admit.  Review of Systems: No focal neurological deficits, and per HPI. All others reviewed and are negative.   Past Medical History:  Diagnosis Date  . BPH (benign prostatic hypertrophy)    WITH MICROSCOPIC HEMATURIA  . Hematuria   . Hyperlipidemia   . Hypertension    has severe LVH per echo in November 2012   History reviewed. No pertinent surgical history. - Never smoker, no EtOH or illicit drugs. Lives at home with his wife. Son helps  them.  reports that  has never smoked. he has never used smokeless tobacco. He reports that he drinks alcohol. He reports that he does not use drugs. No Known Allergies Family History  Family history unknown: Yes   - Family history otherwise reviewed and not pertinent.  Prior to Admission medications   Medication Sig Start Date End Date Taking? Authorizing Provider  aspirin 81 MG tablet Take 81 mg by mouth daily.     Yes [provider]  atorvastatin (LIPITOR) 20 MG tablet Take 1 tablet (20 mg total) by mouth daily. 01/24/17  Yes Lyda Jester M, PA-C  cloNIDine (CATAPRES) 0.1 MG tablet Take 1 tablet (0.1 mg total) by mouth daily. 01/24/17  Yes Simmons, Brittainy M, PA-C  donepezil (ARICEPT) 5 MG tablet TAKE 1 TABLET(5 MG) BY MOUTH AT BEDTIME 04/30/17  Yes Rosita Fire, Brittainy M, PA-C  hydrochlorothiazide (HYDRODIURIL) 25 MG tablet Take 1 tablet (25 mg total) by mouth daily. 01/24/17  Yes Lyda Jester M, PA-C  silodosin (RAPAFLO) 8 MG CAPS capsule Take 1 capsule (8 mg total) by mouth daily. NEED OV. 12/12/17  Yes Rosita Fire, Brittainy M, PA-C  lisinopril (PRINIVIL,ZESTRIL) 10 MG tablet Take 1 tablet (10 mg total) by mouth daily. 01/06/18 04/06/18  Lyda Jester M, PA-C  metoprolol tartrate (LOPRESSOR) 25 MG tablet TAKE 1 TABLET BY MOUTH TWICE DAILY 03/08/17   Consuelo Pandy, PA-C    Physical Exam: Vitals:   01/07/18 1217 01/07/18 1357  BP: 126/67 (!) 118/55  Pulse: (!) 108 92  Resp: 16 20  Temp: (!) 97.5 F (36.4 C)   TempSrc: Oral  SpO2: 100% 100%   Constitutional: 77 y.o. male in no distress, calm demeanor Eyes: Lids and conjunctivae normal, PERRL ENMT: Mucous membranes are moist. Posterior pharynx clear of any exudate or lesions. Fair dentition.  Neck: normal, supple, no masses, no thyromegaly Respiratory: Non-labored breathing room air without accessory muscle use. Clear breath sounds to auscultation bilaterally Cardiovascular: Regular rate and rhythm, no  murmurs, rubs, or gallops. No carotid bruits. No JVD. No LE edema. 2+ pedal pulses. Abdomen: Normoactive bowel sounds. No tenderness, non-distended, and no masses palpated. No hepatosplenomegaly. GU: No indwelling catheter Musculoskeletal: No clubbing / cyanosis. No joint deformity upper and lower extremities. Good ROM, no contractures. Normal muscle tone.  Skin: Warm, dry, no significant pallor. No rashes, wounds, or ulcers. Neurologic: CN II-XII grossly intact. Gait not assessed. Speech without aphasia per his son (non-english speaker). No focal deficits in motor strength or sensation in all extremities.  Psychiatric: Alert, calm. Memory is impaired, behavior appropriate.  Labs on Admission: I have personally reviewed following labs and imaging studies  CBC: Recent Labs  Lab 01/07/18 1339  WBC 10.9*  HGB 8.6*  HCT 30.9*  MCV 119.3*  PLT 427   Basic Metabolic Panel: Recent Labs  Lab 01/07/18 1339  NA 136  K 3.4*  CL 102  CO2 22  GLUCOSE 147*  BUN 25*  CREATININE 1.79*  CALCIUM 8.2*   GFR: CrCl cannot be calculated (Unknown ideal weight.). Liver Function Tests: Recent Labs  Lab 01/07/18 1339  AST 31  ALT 26  ALKPHOS 74  BILITOT 0.7  PROT 6.9  ALBUMIN 3.5   No results for input(s): LIPASE, AMYLASE in the last 168 hours. Recent Labs  Lab 01/07/18 1343  AMMONIA 34   Coagulation Profile: No results for input(s): INR, PROTIME in the last 168 hours. Cardiac Enzymes: Recent Labs  Lab 01/07/18 1343  TROPONINI <0.03   BNP (last 3 results) No results for input(s): PROBNP in the last 8760 hours. HbA1C: No results for input(s): HGBA1C in the last 72 hours. CBG: Recent Labs  Lab 01/07/18 1351  GLUCAP 153*   Lipid Profile: No results for input(s): CHOL, HDL, LDLCALC, TRIG, CHOLHDL, LDLDIRECT in the last 72 hours. Thyroid Function Tests: No results for input(s): TSH, T4TOTAL, FREET4, T3FREE, THYROIDAB in the last 72 hours. Anemia Panel: No results for  input(s): VITAMINB12, FOLATE, FERRITIN, TIBC, IRON, RETICCTPCT in the last 72 hours. Urine analysis:    Component Value Date/Time   COLORURINE YELLOW 09/26/2011 2053   APPEARANCEUR CLEAR 09/26/2011 2053   LABSPEC 1.011 09/26/2011 2053   PHURINE 7.0 09/26/2011 2053   GLUCOSEU NEGATIVE 09/26/2011 2053   HGBUR MODERATE (A) 09/26/2011 2053   BILIRUBINUR NEGATIVE 09/26/2011 2053   [place] 09/26/2011 2053   PROTEINUR 100 (A) 09/26/2011 2053   UROBILINOGEN 0.2 09/26/2011 2053   NITRITE NEGATIVE 09/26/2011 2053   LEUKOCYTESUR NEGATIVE 09/26/2011 2053    No results found for this or any previous visit (from the past 240 hour(s)).   Radiological Exams on Admission: Dg Chest 2 View  Result Date: 01/07/2018 CLINICAL DATA:  Weakness and fatigue, memory loss for the past week. Patient found have decreased renal function and anemia. History of hypertension EXAM: CHEST  2 VIEW COMPARISON:  Chest x-ray of September 26, 2011 FINDINGS: The lungs are reasonably well inflated and clear. The heart and pulmonary vascularity are normal. There is no pleural effusion. There calcification in the wall of the thoracic aorta. There mild multilevel degenerative disc disease of the thoracic spine.  IMPRESSION: There is no pneumonia, CHF, nor other acute cardiopulmonary abnormality. Thoracic aortic atherosclerosis. Electronically Signed   By: David  Martinique M.D.   On: 01/07/2018 14:22   Ct Head Wo Contrast  Result Date: 01/07/2018 CLINICAL DATA:  Altered mental status. EXAM: CT HEAD WITHOUT CONTRAST TECHNIQUE: Contiguous axial images were obtained from the base of the skull through the vertex without intravenous contrast. COMPARISON:  CT head dated September 29, 2011. FINDINGS: Brain: No evidence of acute infarction, hemorrhage, hydrocephalus, extra-axial collection or mass lesion/mass effect. Stable cerebral atrophy and chronic microvascular ischemic changes. Unchanged lacunar infarcts in the bilateral basal ganglia  and right thalamus. Vascular: Atherosclerotic vascular calcification of the carotid siphons. No hyperdense vessel. Skull: Normal. Negative for fracture or focal lesion. Sinuses/Orbits: No acute finding. Other: None. IMPRESSION: 1. No acute intracranial abnormality. Stable cerebral atrophy and chronic microvascular ischemic changes. Electronically Signed   By: Titus Dubin M.D.   On: 01/07/2018 13:36    EKG: Independently reviewed. NSR, no ischemic changes. Diffuse artifact, but has regular intervals and distinct P waves.   Assessment/Plan Active Problems:   HTN (hypertension)   Hyperlipidemia   Dementia   BPH (benign prostatic hypertrophy)   Acute blood loss anemia   Symptomatic anemia   GI bleed   GI bleed with symptomatic acute blood loss anemia: + FOBT, orthostasis.  - EGD 2/13 per GI. If negative, would need colonoscopy. - PPI gtt - Liquids today, NPO p MN - Hold baby aspirin - Anemia panel in AM  HTN with current orthostatic hypotension.  - Hold HCTZ, lisinopril, metoprolol. Will continue low dose clonidine to avoid rebound.   BPH:  - Given orthostasis, will hold rapaflo, monitor UOP  Hyperlipidemia:  - Continue lipitor  AKI on stage III CKD:  - Monitor BMP, I/O - IVF's - With history of BPH, check renal U/S  DVT prophylaxis: SCDs  Code Status: Full code, confirmed with son who is HCPOA  Family Communication: Son Disposition Plan: Uncertain Consults called: GI  Admission status: Inpatient    Vance Gather, MD [facility] [email or web address] Password [facility] 01/07/2018, 4:57 PM

## 2018-01-07 NOTE — H&P (View-Only) (Signed)
Referring Provider:  ER physician - Dr. Gilford Raid  Primary Care Physician:  Patient, No Pcp Per Primary Gastroenterologist:  Dr. Althia Forts  Reason for Consultation:  Anemia, FOBT positive  HPI: Marc [name] is a 77 y.o. male with past medical history of dementia brought into the emergency room for further evaluation of low hemoglobin and elevated kidney function. History obtained from patient's son. Patient had 2 falls in last 1 week. He was subsequently taken to urgent care where he was diagnosed with anemia with hemoglobin of 9.2 as well as renal insufficiency with creatinine of around 1.8. family denied any GI symptoms. Denied black tarry stool or bright red blood per rectum. Denied diarrhea or constipation. Denied acid reflux. Denied dysphagia or odynophagia. Denied any weight loss.  Had colonoscopy 6-7 years ago by Dr. Benson Norway which was normal according to patient's son. Report not available to review  Past Medical History:  Diagnosis Date  . BPH (benign prostatic hypertrophy)    WITH MICROSCOPIC HEMATURIA  . Hematuria   . Hyperlipidemia   . Hypertension    has severe LVH per echo in November 2012    History reviewed. No pertinent surgical history.  Prior to Admission medications   Medication Sig Start Date End Date Taking? Authorizing Provider  aspirin 81 MG tablet Take 81 mg by mouth daily.     Yes [provider]  atorvastatin (LIPITOR) 20 MG tablet Take 1 tablet (20 mg total) by mouth daily. 01/24/17  Yes Lyda Jester M, PA-C  cloNIDine (CATAPRES) 0.1 MG tablet Take 1 tablet (0.1 mg total) by mouth daily. 01/24/17  Yes Simmons, Brittainy M, PA-C  donepezil (ARICEPT) 5 MG tablet TAKE 1 TABLET(5 MG) BY MOUTH AT BEDTIME 04/30/17  Yes Rosita Fire, Brittainy M, PA-C  hydrochlorothiazide (HYDRODIURIL) 25 MG tablet Take 1 tablet (25 mg total) by mouth daily. 01/24/17  Yes Lyda Jester M, PA-C  silodosin (RAPAFLO) 8 MG CAPS capsule Take 1 capsule (8 mg total) by mouth daily. NEED  OV. 12/12/17  Yes Rosita Fire, Brittainy M, PA-C  lisinopril (PRINIVIL,ZESTRIL) 10 MG tablet Take 1 tablet (10 mg total) by mouth daily. 01/06/18 04/06/18  Lyda Jester M, PA-C  metoprolol tartrate (LOPRESSOR) 25 MG tablet TAKE 1 TABLET BY MOUTH TWICE DAILY 03/08/17   Lyda Jester M, PA-C    Scheduled Meds: Continuous Infusions: . sodium chloride    . pantoprazole (PROTONIX) IVPB    . pantoprozole (PROTONIX) infusion     PRN Meds:.  Allergies as of 01/07/2018  . (No Known Allergies)    Family History  Family history unknown: Yes    Social History   Socioeconomic History  . Marital status: Married    Spouse name: Not on file  . Number of children: Not on file  . Years of education: Not on file  . Highest education level: Not on file  Social Needs  . Financial resource strain: Not on file  . Food insecurity - worry: Not on file  . Food insecurity - inability: Not on file  . Transportation needs - medical: Not on file  . Transportation needs - non-medical: Not on file  Occupational History  . Not on file  Tobacco Use  . Smoking status: Never Smoker  . Smokeless tobacco: Never Used  Substance and Sexual Activity  . Alcohol use: Yes    Comment: OCCASSIONAL  . Drug use: No  . Sexual activity: Not on file  Other Topics Concern  . Not on file  Social History Narrative  .  Not on file    Review of Systems: Review of Systems  Constitutional: Negative for chills and fever.  HENT: Negative for hearing loss and tinnitus.   Eyes: Negative for blurred vision and double vision.  Respiratory: Negative for cough and hemoptysis.   Cardiovascular: Negative for chest pain and palpitations.  Gastrointestinal: Negative for abdominal pain, constipation, diarrhea, heartburn, melena, nausea and vomiting.  Genitourinary: Negative for dysuria and urgency.  Skin: Negative for rash.  Neurological: Positive for weakness. Negative for seizures.  Endo/Heme/Allergies: Does not  bruise/bleed easily.  Psychiatric/Behavioral: Positive for memory loss.    Physical Exam: Vital signs: Vitals:   01/07/18 1217 01/07/18 1357  BP: 126/67 (!) 118/55  Pulse: (!) 108 92  Resp: 16 20  Temp: (!) 97.5 F (36.4 C)   SpO2: 100% 100%     Physical Exam  Constitutional: He appears well-developed and well-nourished. No distress.  HENT:  Head: Normocephalic and atraumatic.  Mouth/Throat: No oropharyngeal exudate.  Eyes: EOM are normal. No scleral icterus.  Neck: Normal range of motion. Neck supple. No thyromegaly present.  Cardiovascular: Normal rate, regular rhythm and normal heart sounds.  Pulmonary/Chest: Effort normal and breath sounds normal. No respiratory distress.  Abdominal: Soft. Bowel sounds are normal. He exhibits no distension. There is no tenderness. There is no rebound and no guarding.  Musculoskeletal: Normal range of motion. He exhibits no edema.  Neurological:  Alert and oriented to person.  Skin: Skin is warm. No erythema.  Psychiatric: He has a normal mood and affect.  Vitals reviewed.   GI:  Lab Results: Recent Labs    01/07/18 1339  WBC 10.9*  HGB 8.6*  HCT 30.9*  PLT 201   BMET Recent Labs    01/07/18 1339  NA 136  K 3.4*  CL 102  CO2 22  GLUCOSE 147*  BUN 25*  CREATININE 1.79*  CALCIUM 8.2*   LFT Recent Labs    01/07/18 1339  PROT 6.9  ALBUMIN 3.5  AST 31  ALT 26  ALKPHOS 74  BILITOT 0.7   PT/INR No results for input(s): LABPROT, INR in the last 72 hours.   Studies/Results: Dg Chest 2 View  Result Date: 01/07/2018 CLINICAL DATA:  Weakness and fatigue, memory loss for the past week. Patient found have decreased renal function and anemia. History of hypertension EXAM: CHEST  2 VIEW COMPARISON:  Chest x-ray of September 26, 2011 FINDINGS: The lungs are reasonably well inflated and clear. The heart and pulmonary vascularity are normal. There is no pleural effusion. There calcification in the wall of the thoracic aorta.  There mild multilevel degenerative disc disease of the thoracic spine. IMPRESSION: There is no pneumonia, CHF, nor other acute cardiopulmonary abnormality. Thoracic aortic atherosclerosis. Electronically Signed   By: David  Martinique M.D.   On: 01/07/2018 14:22   Ct Head Wo Contrast  Result Date: 01/07/2018 CLINICAL DATA:  Altered mental status. EXAM: CT HEAD WITHOUT CONTRAST TECHNIQUE: Contiguous axial images were obtained from the base of the skull through the vertex without intravenous contrast. COMPARISON:  CT head dated September 29, 2011. FINDINGS: Brain: No evidence of acute infarction, hemorrhage, hydrocephalus, extra-axial collection or mass lesion/mass effect. Stable cerebral atrophy and chronic microvascular ischemic changes. Unchanged lacunar infarcts in the bilateral basal ganglia and right thalamus. Vascular: Atherosclerotic vascular calcification of the carotid siphons. No hyperdense vessel. Skull: Normal. Negative for fracture or focal lesion. Sinuses/Orbits: No acute finding. Other: None. IMPRESSION: 1. No acute intracranial abnormality. Stable cerebral atrophy and chronic microvascular  ischemic changes. Electronically Signed   By: Titus Dubin M.D.   On: 01/07/2018 13:36    Impression/Plan: - Symptomatic anemia. No overt bleeding - Occult blood positive stool - Macrocytic anemia with MCV of 119 - Elevated kidney function. ?? Chronic kidney insufficiency - Dementia  Recommendations -------------------------- - EGD tomorrow for further evaluation. Risk benefits and alternatives discussed with patient's son who verbalized understanding. If EGD is negative, he may need colonoscopy for further evaluation. - Iron panel and ferritin, vitamin B12 and folate level - Okay to have a liquid diet for today from GI standpoint. Nothing by mouth past midnight - Further plan based on endoscopy findings.   LOS: 0 days   Otis Brace  MD, FACP 01/07/2018, 3:24 PM  Contact #  [phone]
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
## 2018-01-07 NOTE — ED Notes (Signed)
PT and family member aware of urine sample

## 2018-01-07 NOTE — Consult Note (Addendum)
Referring Provider:  ER physician - Dr. Gilford Raid  Primary Care Physician:  Patient, No Pcp Per Primary Gastroenterologist:  Dr. Althia Forts  Reason for Consultation:  Anemia, FOBT positive  HPI: Marc [name] is a 77 y.o. male with past medical history of dementia brought into the emergency room for further evaluation of low hemoglobin and elevated kidney function. History obtained from patient's son. Patient had 2 falls in last 1 week. He was subsequently taken to urgent care where he was diagnosed with anemia with hemoglobin of 9.2 as well as renal insufficiency with creatinine of around 1.8. family denied any GI symptoms. Denied black tarry stool or bright red blood per rectum. Denied diarrhea or constipation. Denied acid reflux. Denied dysphagia or odynophagia. Denied any weight loss.  Had colonoscopy 6-7 years ago by Dr. Benson Norway which was normal according to patient's son. Report not available to review  Past Medical History:  Diagnosis Date  . BPH (benign prostatic hypertrophy)    WITH MICROSCOPIC HEMATURIA  . Hematuria   . Hyperlipidemia   . Hypertension    has severe LVH per echo in November 2012    History reviewed. No pertinent surgical history.  Prior to Admission medications   Medication Sig Start Date End Date Taking? Authorizing Provider  aspirin 81 MG tablet Take 81 mg by mouth daily.     Yes [provider]  atorvastatin (LIPITOR) 20 MG tablet Take 1 tablet (20 mg total) by mouth daily. 01/24/17  Yes Lyda Jester M, PA-C  cloNIDine (CATAPRES) 0.1 MG tablet Take 1 tablet (0.1 mg total) by mouth daily. 01/24/17  Yes Simmons, Brittainy M, PA-C  donepezil (ARICEPT) 5 MG tablet TAKE 1 TABLET(5 MG) BY MOUTH AT BEDTIME 04/30/17  Yes Rosita Fire, Brittainy M, PA-C  hydrochlorothiazide (HYDRODIURIL) 25 MG tablet Take 1 tablet (25 mg total) by mouth daily. 01/24/17  Yes Lyda Jester M, PA-C  silodosin (RAPAFLO) 8 MG CAPS capsule Take 1 capsule (8 mg total) by mouth daily. NEED  OV. 12/12/17  Yes Rosita Fire, Brittainy M, PA-C  lisinopril (PRINIVIL,ZESTRIL) 10 MG tablet Take 1 tablet (10 mg total) by mouth daily. 01/06/18 04/06/18  Lyda Jester M, PA-C  metoprolol tartrate (LOPRESSOR) 25 MG tablet TAKE 1 TABLET BY MOUTH TWICE DAILY 03/08/17   Lyda Jester M, PA-C    Scheduled Meds: Continuous Infusions: . sodium chloride    . pantoprazole (PROTONIX) IVPB    . pantoprozole (PROTONIX) infusion     PRN Meds:.  Allergies as of 01/07/2018  . (No Known Allergies)    Family History  Family history unknown: Yes    Social History   Socioeconomic History  . Marital status: Married    Spouse name: Not on file  . Number of children: Not on file  . Years of education: Not on file  . Highest education level: Not on file  Social Needs  . Financial resource strain: Not on file  . Food insecurity - worry: Not on file  . Food insecurity - inability: Not on file  . Transportation needs - medical: Not on file  . Transportation needs - non-medical: Not on file  Occupational History  . Not on file  Tobacco Use  . Smoking status: Never Smoker  . Smokeless tobacco: Never Used  Substance and Sexual Activity  . Alcohol use: Yes    Comment: OCCASSIONAL  . Drug use: No  . Sexual activity: Not on file  Other Topics Concern  . Not on file  Social History Narrative  .  Not on file    Review of Systems: Review of Systems  Constitutional: Negative for chills and fever.  HENT: Negative for hearing loss and tinnitus.   Eyes: Negative for blurred vision and double vision.  Respiratory: Negative for cough and hemoptysis.   Cardiovascular: Negative for chest pain and palpitations.  Gastrointestinal: Negative for abdominal pain, constipation, diarrhea, heartburn, melena, nausea and vomiting.  Genitourinary: Negative for dysuria and urgency.  Skin: Negative for rash.  Neurological: Positive for weakness. Negative for seizures.  Endo/Heme/Allergies: Does not  bruise/bleed easily.  Psychiatric/Behavioral: Positive for memory loss.    Physical Exam: Vital signs: Vitals:   01/07/18 1217 01/07/18 1357  BP: 126/67 (!) 118/55  Pulse: (!) 108 92  Resp: 16 20  Temp: (!) 97.5 F (36.4 C)   SpO2: 100% 100%     Physical Exam  Constitutional: He appears well-developed and well-nourished. No distress.  HENT:  Head: Normocephalic and atraumatic.  Mouth/Throat: No oropharyngeal exudate.  Eyes: EOM are normal. No scleral icterus.  Neck: Normal range of motion. Neck supple. No thyromegaly present.  Cardiovascular: Normal rate, regular rhythm and normal heart sounds.  Pulmonary/Chest: Effort normal and breath sounds normal. No respiratory distress.  Abdominal: Soft. Bowel sounds are normal. He exhibits no distension. There is no tenderness. There is no rebound and no guarding.  Musculoskeletal: Normal range of motion. He exhibits no edema.  Neurological:  Alert and oriented to person.  Skin: Skin is warm. No erythema.  Psychiatric: He has a normal mood and affect.  Vitals reviewed.   GI:  Lab Results: Recent Labs    01/07/18 1339  WBC 10.9*  HGB 8.6*  HCT 30.9*  PLT 201   BMET Recent Labs    01/07/18 1339  NA 136  K 3.4*  CL 102  CO2 22  GLUCOSE 147*  BUN 25*  CREATININE 1.79*  CALCIUM 8.2*   LFT Recent Labs    01/07/18 1339  PROT 6.9  ALBUMIN 3.5  AST 31  ALT 26  ALKPHOS 74  BILITOT 0.7   PT/INR No results for input(s): LABPROT, INR in the last 72 hours.   Studies/Results: Dg Chest 2 View  Result Date: 01/07/2018 CLINICAL DATA:  Weakness and fatigue, memory loss for the past week. Patient found have decreased renal function and anemia. History of hypertension EXAM: CHEST  2 VIEW COMPARISON:  Chest x-ray of September 26, 2011 FINDINGS: The lungs are reasonably well inflated and clear. The heart and pulmonary vascularity are normal. There is no pleural effusion. There calcification in the wall of the thoracic aorta.  There mild multilevel degenerative disc disease of the thoracic spine. IMPRESSION: There is no pneumonia, CHF, nor other acute cardiopulmonary abnormality. Thoracic aortic atherosclerosis. Electronically Signed   By: David  Martinique M.D.   On: 01/07/2018 14:22   Ct Head Wo Contrast  Result Date: 01/07/2018 CLINICAL DATA:  Altered mental status. EXAM: CT HEAD WITHOUT CONTRAST TECHNIQUE: Contiguous axial images were obtained from the base of the skull through the vertex without intravenous contrast. COMPARISON:  CT head dated September 29, 2011. FINDINGS: Brain: No evidence of acute infarction, hemorrhage, hydrocephalus, extra-axial collection or mass lesion/mass effect. Stable cerebral atrophy and chronic microvascular ischemic changes. Unchanged lacunar infarcts in the bilateral basal ganglia and right thalamus. Vascular: Atherosclerotic vascular calcification of the carotid siphons. No hyperdense vessel. Skull: Normal. Negative for fracture or focal lesion. Sinuses/Orbits: No acute finding. Other: None. IMPRESSION: 1. No acute intracranial abnormality. Stable cerebral atrophy and chronic microvascular  ischemic changes. Electronically Signed   By: Titus Dubin M.D.   On: 01/07/2018 13:36    Impression/Plan: - Symptomatic anemia. No overt bleeding - Occult blood positive stool - Macrocytic anemia with MCV of 119 - Elevated kidney function. ?? Chronic kidney insufficiency - Dementia  Recommendations -------------------------- - EGD tomorrow for further evaluation. Risk benefits and alternatives discussed with patient's son who verbalized understanding. If EGD is negative, he may need colonoscopy for further evaluation. - Iron panel and ferritin, vitamin B12 and folate level - Okay to have a liquid diet for today from GI standpoint. Nothing by mouth past midnight - Further plan based on endoscopy findings.   LOS: 0 days   Otis Brace  MD, FACP 01/07/2018, 3:24 PM  Contact #  [phone]

## 2018-01-07 NOTE — ED Triage Notes (Signed)
Patient here from PCP office. Kidney function elevated and Hgb low. Patient taken to urgent care by son for feeling weak, fatigue, memory loss x1 week. Hx of hypertension.

## 2018-01-08 ENCOUNTER — Encounter (HOSPITAL_COMMUNITY)
Admission: EM | Disposition: A | Discharge status: Home / Self Care | Payer: Self-pay | Source: Home / Self Care | Attending: Internal Medicine

## 2018-01-08 ENCOUNTER — Inpatient Hospital Stay (HOSPITAL_COMMUNITY): Payer: Medicare HMO | Admitting: Registered Nurse

## 2018-01-08 ENCOUNTER — Encounter (HOSPITAL_COMMUNITY): Payer: Self-pay | Admitting: Registered Nurse

## 2018-01-08 DIAGNOSIS — K922 Gastrointestinal hemorrhage, unspecified: Principal | ICD-10-CM

## 2018-01-08 HISTORY — PX: ESOPHAGOGASTRODUODENOSCOPY (EGD) WITH PROPOFOL: SHX5813

## 2018-01-08 LAB — BASIC METABOLIC PANEL
Anion gap: 11 (ref 5–15)
Anion gap: 6 (ref 5–15)
BUN: 18 mg/dL (ref 6–20)
BUN: 16 mg/dL (ref 6–20)
CALCIUM: 7.6 mg/dL — AB (ref 8.9–10.3)
CO2: 21 mmol/L — ABNORMAL LOW (ref 22–32)
CO2: 23 mmol/L (ref 22–32)
CREATININE: 1.64 mg/dL — AB (ref 0.61–1.24)
Calcium: 7.6 mg/dL — ABNORMAL LOW (ref 8.9–10.3)
Chloride: 107 mmol/L (ref 101–111)
Chloride: 111 mmol/L (ref 101–111)
Creatinine, Ser: 1.67 mg/dL — ABNORMAL HIGH (ref 0.61–1.24)
GFR calc Af Amer: 45 mL/min — ABNORMAL LOW (ref >60)
GFR calc Af Amer: 44 mL/min — ABNORMAL LOW (ref >60)
GFR, EST NON AFRICAN AMERICAN: 39 mL/min — AB (ref >60)
GFR, EST NON AFRICAN AMERICAN: 38 mL/min — AB (ref >60)
Glucose, Bld: 110 mg/dL — ABNORMAL HIGH (ref 65–99)
Glucose, Bld: 126 mg/dL — ABNORMAL HIGH (ref 65–99)
POTASSIUM: 3.3 mmol/L — AB (ref 3.5–5.1)
Potassium: 3.2 mmol/L — ABNORMAL LOW (ref 3.5–5.1)
SODIUM: 139 mmol/L (ref 135–145)
SODIUM: 140 mmol/L (ref 135–145)

## 2018-01-08 LAB — CBC
HCT: 21.4 % — ABNORMAL LOW (ref 39.0–52.0)
Hemoglobin: 7.3 g/dL — ABNORMAL LOW (ref 13.0–17.0)
MCH: 33.3 pg (ref 26.0–34.0)
MCHC: 34.1 g/dL (ref 30.0–36.0)
MCV: 97.7 fL (ref 78.0–100.0)
PLATELETS: 174 10*3/uL (ref 150–400)
RBC: 2.19 MIL/uL — AB (ref 4.22–5.81)
RDW: 14.6 % (ref 11.5–15.5)
WBC: 8.6 10*3/uL (ref 4.0–10.5)

## 2018-01-08 LAB — ABO/RH: ABO/RH(D): B POS

## 2018-01-08 LAB — IRON AND TIBC
IRON: 28 ug/dL — AB (ref 45–182)
Iron: 35 ug/dL — ABNORMAL LOW (ref 45–182)
SATURATION RATIOS: 12 % — AB (ref 17.9–39.5)
Saturation Ratios: 15 % — ABNORMAL LOW (ref 17.9–39.5)
TIBC: 242 ug/dL — AB (ref 250–450)
TIBC: 227 ug/dL — ABNORMAL LOW (ref 250–450)
UIBC: 214 ug/dL
UIBC: 192 ug/dL

## 2018-01-08 LAB — FOLATE RBC
FOLATE, HEMOLYSATE: 299.2 ng/mL
FOLATE, RBC: 1330 ng/mL (ref >498)
HEMATOCRIT: 22.5 % — AB (ref 37.5–51.0)

## 2018-01-08 LAB — VITAMIN B12
VITAMIN B 12: 536 pg/mL (ref 180–914)
Vitamin B-12: 684 pg/mL (ref 180–914)

## 2018-01-08 LAB — RETICULOCYTES
RBC.: 2.19 MIL/uL — AB (ref 4.22–5.81)
RETIC COUNT ABSOLUTE: 214.6 10*3/uL — AB (ref 19.0–186.0)
RETIC CT PCT: 9.8 % — AB (ref 0.4–3.1)

## 2018-01-08 LAB — FERRITIN
FERRITIN: 140 ng/mL (ref 24–336)
Ferritin: 148 ng/mL (ref 24–336)

## 2018-01-08 LAB — PREPARE RBC (CROSSMATCH)

## 2018-01-08 LAB — FOLATE: Folate: 15.3 ng/mL (ref >5.9)

## 2018-01-08 SURGERY — ESOPHAGOGASTRODUODENOSCOPY (EGD) WITH PROPOFOL
Anesthesia: Monitor Anesthesia Care

## 2018-01-08 MED ORDER — SODIUM CHLORIDE 0.9 % IV SOLN: INTRAVENOUS | Status: DC

## 2018-01-08 MED ORDER — POTASSIUM CHLORIDE 10 MEQ/100ML IV SOLN
10.0000 meq | INTRAVENOUS | Status: AC
  Administered 2018-01-08 (×3): 10 meq via INTRAVENOUS
  Filled 2018-01-08 (×3): qty 100

## 2018-01-08 MED ORDER — PEG 3350-KCL-NA BICARB-NACL 420 G PO SOLR
4000.0000 mL | Freq: Once | ORAL | Status: AC
  Administered 2018-01-08: 4000 mL via ORAL

## 2018-01-08 MED ORDER — SODIUM CHLORIDE 0.9 % IV SOLN: Freq: Once | INTRAVENOUS | Status: DC

## 2018-01-08 MED ORDER — LACTATED RINGERS IV SOLN
INTRAVENOUS | Status: DC | PRN
  Administered 2018-01-08: 12:00:00 via INTRAVENOUS

## 2018-01-08 MED ORDER — ONDANSETRON HCL 4 MG/2ML IJ SOLN
INTRAMUSCULAR | Status: DC | PRN
  Administered 2018-01-08: 4 mg via INTRAVENOUS

## 2018-01-08 MED ORDER — PROPOFOL 10 MG/ML IV BOLUS
INTRAVENOUS | Status: AC
  Filled 2018-01-08: qty 20

## 2018-01-08 MED ORDER — PROPOFOL 10 MG/ML IV BOLUS
INTRAVENOUS | Status: DC | PRN
  Administered 2018-01-08 (×2): 20 mg via INTRAVENOUS
  Administered 2018-01-08: 10 mg via INTRAVENOUS
  Administered 2018-01-08 (×2): 20 mg via INTRAVENOUS

## 2018-01-08 MED ORDER — LIDOCAINE 2% (20 MG/ML) 5 ML SYRINGE
INTRAMUSCULAR | Status: DC | PRN
  Administered 2018-01-08: 40 mg via INTRAVENOUS

## 2018-01-08 MED ORDER — PHENYLEPHRINE 40 MCG/ML (10ML) SYRINGE FOR IV PUSH (FOR BLOOD PRESSURE SUPPORT)
PREFILLED_SYRINGE | INTRAVENOUS | Status: DC | PRN
  Administered 2018-01-08: 80 ug via INTRAVENOUS

## 2018-01-08 SURGICAL SUPPLY — 14 items

## 2018-01-08 NOTE — Op Note (Signed)
[facility] Patient Name: Marc [name] Procedure Date: 01/08/2018 MRN: [id number] Attending MD: Otis Brace , MD Date of Birth: 1941/11/04 CSN: [id number] Age: 76 Admit Type: Inpatient Procedure:                Upper GI endoscopy Indications:              Heme positive stool Providers:                Otis Brace, MD, Cleda Daub, RN, Tinnie Gens, Technician, Charolette Child, Technician, Marla Roe, CRNA Referring MD:              Medicines:                Sedation Administered by an Anesthesia Professional Complications:            No immediate complications. Estimated Blood Loss:     Estimated blood loss was minimal. Procedure:                Pre-Anesthesia Assessment:                           - Prior to the procedure, a History and Physical                            was performed, and patient medications and                            allergies were reviewed. The patient's tolerance of                            previous anesthesia was also reviewed. The risks                            and benefits of the procedure and the sedation                            options and risks were discussed with the patient.                            All questions were answered, and informed consent                            was obtained. Prior Anticoagulants: The patient has                            taken aspirin, last dose was 2 days prior to                            procedure. ASA Grade Assessment: III - A patient  with severe systemic disease. After reviewing the                            risks and benefits, the patient was deemed in                            satisfactory condition to undergo the procedure.                           After obtaining informed consent, the endoscope was                            passed under direct vision. Throughout the     procedure, the patient's blood pressure, pulse, and                            oxygen saturations were monitored continuously. The                            Endoscope was introduced through the mouth, and                            advanced to the second part of duodenum. The upper                            GI endoscopy was accomplished without difficulty.                            The patient tolerated the procedure well. Scope In: Scope Out: Findings:      LA Grade B (one or more mucosal breaks greater than 5 mm, not extending       between the tops of two mucosal folds) esophagitis was found in the       distal esophagus with some abnormal appearing mucosa. Biopsies were       taken with a cold forceps for histology. Estimated blood loss was       minimal.      Normal mucosa was found in the entire examined stomach. Biopsies were       taken with a cold forceps for Helicobacter pylori testing.      Two small sessile polyps were found in the gastric fundus and in the       gastric body. Biopsies were taken with a cold forceps for histology.      The cardia and gastric fundus were normal on retroflexion.      The duodenal bulb, first portion of the duodenum and second portion of       the duodenum were normal. Biopsies for histology were taken with a cold       forceps for evaluation of celiac disease. Impression:               - LA Grade B esophagitis. Biopsied.                           - Normal mucosa was found in the entire stomach.  Biopsied.                           - Two gastric polyps. Biopsied.                           - Normal duodenal bulb, first portion of the                            duodenum and second portion of the duodenum.                            Biopsied. Moderate Sedation:      Moderate (conscious) sedation was personally administered by an       anesthesia professional. The following parameters were monitored: oxygen        saturation, heart rate, blood pressure, and response to care. Recommendation:           - Return patient to hospital ward for ongoing care.                           - Clear liquid diet.                           - Continue present medications.                           - Await pathology results.                           - Perform a colonoscopy tomorrow.                           - Repeat upper endoscopy in 2 months to check                            healing. Procedure Code(s):        --- Professional ---                           [id number], Esophagogastroduodenoscopy, flexible,                            transoral; with biopsy, single or multiple Diagnosis Code(s):        --- Professional ---                           K20.9, Esophagitis, unspecified                           K31.7, Polyp of stomach and duodenum                           R19.5, Other fecal abnormalities CPT copyright 2016 [employer]. All rights reserved. The codes documented in this report are preliminary and upon coder review may  be revised to meet current compliance requirements. Otis Brace, MD Otis Brace, MD 01/08/2018 12:30:52 PM Number of Addenda: 0

## 2018-01-08 NOTE — Care Management Note (Signed)
Case Management Note  Patient Details  Name: Marc [name] MRN: [id number] Date of Birth: 1941-11-15  Subjective/Objective:                  Gi bleed and anemia  Action/Plan: Date: January 08, 2018 Velva Harman, BSN, [place], [place] Chart and notes review for patient progress and needs. Will follow for case management and discharge needs. No cm or discharge needs present at time of this review. Next review date: [id number]  Expected Discharge Date:  (unknown)               Expected Discharge Plan:  Home/Self Care  In-House Referral:     Discharge planning Services  CM Consult  Post Acute Care Choice:    Choice offered to:     DME Arranged:    DME Agency:     HH Arranged:    HH Agency:     Status of Service:  In process, will continue to follow  If discussed at Long Length of Stay Meetings, dates discussed:    Additional Comments:  Leeroy Cha, RN 01/08/2018, 9:05 AM

## 2018-01-08 NOTE — Progress Notes (Signed)
PROGRESS NOTE    Marc [name]  IDP:[id number] DOB: [birth year] DOA: 01/07/2018 PCP: Patient, No Pcp Per   Brief Narrative: 77 y.o. male with a history of dementia, HTN, HLD who presented to the ED after being called by urgent care for abnormal labs. History is per son and EMR due to patient's dementia. His son reports more frequent falls, most recently a fall at home 2/7 without significant injury, though the patient was lightheaded, BP taken at home was 70/40, but improve to 110s/60s with time. He denied LOC, attributed it to generalized fatigue/weakness which continued, constant, worsening, until he took his father to urgent care 2/11 for evaluation (has no real PCP). He was discharged to home after labsd were drawn and were called this morning advising he go to the ED for elevated creatinine and low hemoglobin. Denies fever, chills, weight loss, chest pain, palpitations, syncope, shortness of breath, abdominal pain, nausea, vomiting, changes in bowel habits, dark tarry or red stools, change in bladder habits, hematuria, myalgias, arthralgias, and rash.     Assessment & Plan:   Active Problems:   HTN (hypertension)   Hyperlipidemia   Dementia   BPH (benign prostatic hypertrophy)   Acute blood loss anemia   Symptomatic anemia   GI bleed  GI bleed with symptomatic acute blood loss anemia: + FOBT, orthostasis.  - EGD 2/13 per GI. If negative, would need colonoscopy. - PPI gtt -  NPO - Hold baby aspirin -Hemoglobin down to 7.3 will arrange for blood transfusion today.  HTN with current orthostatic hypotension.  - Hold HCTZ, lisinopril, metoprolol. Will continue low dose clonidine to avoid rebound.   BPH:  - Given orthostasis, will hold rapaflo, monitor UOP  Hyperlipidemia:  - Continue lipitor  AKI on stage III CKD:  - Monitor BMP, I/O - IVF's - With history of BPH, check renal U/S  Hypokalemia we will replete.      DVT prophylaxis: SCD Code Status: Full code Family  Communication: Discussed with son Disposition Plan: TBD Consultants: GI  Procedures: For EGD today Antimicrobials None Subjective: Patient has been n.p.o. overnight denies any new complaints today son by the bedside.   Objective: Vitals:   01/07/18 1900 01/07/18 2019 01/08/18 0533 01/08/18 0849  BP: (!) 119/94 (!) 149/59 124/62 140/70  Pulse: 77 92 84 77  Resp: 15  16   Temp:  (!) 97.3 F (36.3 C) (!) 97.4 F (36.3 C)   TempSrc:  Oral Oral   SpO2: 100% 100% 100%   Weight:  73.5 kg (162 lb 0.6 oz)    Height:  5\' 7"  (1.702 m)      Intake/Output Summary (Last 24 hours) at 01/08/2018 1038 Last data filed at 01/08/2018 0742 Gross per 24 hour  Intake 2600 ml  Output 375 ml  Net 2225 ml   Filed Weights   01/07/18 2019  Weight: 73.5 kg (162 lb 0.6 oz)    Examination:  General exam: Appears calm and comfortable  Respiratory system: Clear to auscultation. Respiratory effort normal. Cardiovascular system: S1 & S2 heard, RRR. No JVD, murmurs, rubs, gallops or clicks. No pedal edema. Gastrointestinal system: Abdomen is nondistended, soft and nontender. No organomegaly or masses felt. Normal bowel sounds heard. Central nervous system: Alert and oriented. No focal neurological deficits. Extremities: Symmetric 5 x 5 power. Skin: No rashes, lesions or ulcers Psychiatry: Judgement and insight appear normal. Mood & affect appropriate.     Data Reviewed: I have personally reviewed following labs and imaging studies  CBC: Recent Labs  Lab 01/07/18 1339 01/08/18 0606  WBC 10.9* 8.6  HGB 8.6* 7.3*  HCT 30.9* 21.4*  MCV 119.3* 97.7  PLT 201 161   Basic Metabolic Panel: Recent Labs  Lab 01/07/18 1339 01/08/18 0606  NA 136 139  K 3.4* 3.2*  CL 102 107  CO2 22 21*  GLUCOSE 147* 110*  BUN 25* 18  CREATININE 1.79* 1.64*  CALCIUM 8.2* 7.6*   GFR: Estimated Creatinine Clearance: 35.8 mL/min (A) (by C-G formula based on SCr of 1.64 mg/dL (H)). Liver Function  Tests: Recent Labs  Lab 01/07/18 1339  AST 31  ALT 26  ALKPHOS 74  BILITOT 0.7  PROT 6.9  ALBUMIN 3.5   No results for input(s): LIPASE, AMYLASE in the last 168 hours. Recent Labs  Lab 01/07/18 1343  AMMONIA 34   Coagulation Profile: No results for input(s): INR, PROTIME in the last 168 hours. Cardiac Enzymes: Recent Labs  Lab 01/07/18 1343  TROPONINI <0.03   BNP (last 3 results) No results for input(s): PROBNP in the last 8760 hours. HbA1C: No results for input(s): HGBA1C in the last 72 hours. CBG: Recent Labs  Lab 01/07/18 1351  GLUCAP 153*   Lipid Profile: No results for input(s): CHOL, HDL, LDLCALC, TRIG, CHOLHDL, LDLDIRECT in the last 72 hours. Thyroid Function Tests: No results for input(s): TSH, T4TOTAL, FREET4, T3FREE, THYROIDAB in the last 72 hours. Anemia Panel: Recent Labs    01/07/18 1504 01/07/18 1540 01/08/18 0606  VITAMINB12  --  684 536  FOLATE  --   --  15.3  FERRITIN 148  --  140  TIBC 242*  --  227*  IRON 28*  --  35*  RETICCTPCT  --   --  9.8*   Sepsis Labs: No results for input(s): PROCALCITON, LATICACIDVEN in the last 168 hours.  No results found for this or any previous visit (from the past 240 hour(s)).       Radiology Studies: Dg Chest 2 View  Result Date: 01/07/2018 CLINICAL DATA:  Weakness and fatigue, memory loss for the past week. Patient found have decreased renal function and anemia. History of hypertension EXAM: CHEST  2 VIEW COMPARISON:  Chest x-ray of September 26, 2011 FINDINGS: The lungs are reasonably well inflated and clear. The heart and pulmonary vascularity are normal. There is no pleural effusion. There calcification in the wall of the thoracic aorta. There mild multilevel degenerative disc disease of the thoracic spine. IMPRESSION: There is no pneumonia, CHF, nor other acute cardiopulmonary abnormality. Thoracic aortic atherosclerosis. Electronically Signed   By: David  Martinique M.D.   On: 01/07/2018 14:22   Ct  Head Wo Contrast  Result Date: 01/07/2018 CLINICAL DATA:  Altered mental status. EXAM: CT HEAD WITHOUT CONTRAST TECHNIQUE: Contiguous axial images were obtained from the base of the skull through the vertex without intravenous contrast. COMPARISON:  CT head dated September 29, 2011. FINDINGS: Brain: No evidence of acute infarction, hemorrhage, hydrocephalus, extra-axial collection or mass lesion/mass effect. Stable cerebral atrophy and chronic microvascular ischemic changes. Unchanged lacunar infarcts in the bilateral basal ganglia and right thalamus. Vascular: Atherosclerotic vascular calcification of the carotid siphons. No hyperdense vessel. Skull: Normal. Negative for fracture or focal lesion. Sinuses/Orbits: No acute finding. Other: None. IMPRESSION: 1. No acute intracranial abnormality. Stable cerebral atrophy and chronic microvascular ischemic changes. Electronically Signed   By: Titus Dubin M.D.   On: 01/07/2018 13:36        Scheduled Meds: . atorvastatin  20 mg  Oral q1800  . cloNIDine  0.1 mg Oral Daily  . donepezil  5 mg Oral QHS   Continuous Infusions: . sodium chloride 125 mL/hr at 01/08/18 0444  . pantoprozole (PROTONIX) infusion 8 mg/hr (01/08/18 0444)     LOS: 1 day      Georgette Shell, MD [facility]  If 7PM-7AM, please contact night-coverage [email or web address] Password [email or web address] 01/08/2018, 10:38 AM

## 2018-01-08 NOTE — Interval H&P Note (Signed)
History and Physical Interval Note:  01/08/2018 11:55 AM  Marc [name]  has presented today for surgery, with the diagnosis of Anemia, FOBT +  The various methods of treatment have been discussed with the patient and family. After consideration of risks, benefits and other options for treatment, the patient has consented to  Procedure(s): ESOPHAGOGASTRODUODENOSCOPY (EGD) WITH PROPOFOL (N/A) as a surgical intervention .  The patient's history has been reviewed, patient examined, no change in status, stable for surgery.  I have reviewed the patient's chart and labs.  Questions were answered to the patient's satisfaction.    Risks (bleeding, infection, bowel perforation that could require surgery, sedation-related changes in cardiopulmonary systems), benefits (identification and possible treatment of source of symptoms, exclusion of certain causes of symptoms), and alternatives (watchful waiting, radiographic imaging studies, empiric medical treatment)  were explained to patient and family in detail and patient wishes to proceed.   Otis Brace MD

## 2018-01-08 NOTE — Anesthesia Procedure Notes (Signed)
Date/Time: 01/08/2018 12:04 PM Performed by: Talbot Grumbling, CRNA Oxygen Delivery Method: Nasal cannula

## 2018-01-08 NOTE — Brief Op Note (Addendum)
01/07/2018 - 01/08/2018  12:35 PM  PATIENT:  Marc [name]  77 y.o. male  PRE-OPERATIVE DIAGNOSIS:  Anemia, FOBT +  POST-OPERATIVE DIAGNOSIS:  Mild distal esophagitis  PROCEDURE:  Procedure(s): ESOPHAGOGASTRODUODENOSCOPY (EGD) WITH PROPOFOL (N/A)  SURGEON:  Surgeon(s) and Role:    * Huda Petrey, MD - Primary  Findings ----------- - EGD showed mild distal esophagitis. No evidence of active bleeding. Biopsies taken  Recommendations ------------------------ - Colonoscopy tomorrow. - Clear liquid diet today. Nothing by mouth past midnight - Findings recommendations discussed with the patient's son. Procedure colonoscopy also discussed. Verbalized understanding.  Otis Brace MD, [place] 01/08/2018, 12:37 PM  Contact #  [phone]

## 2018-01-08 NOTE — Transfer of Care (Signed)
Immediate Anesthesia Transfer of Care Note  Patient: Marc [name]  Procedure(s) Performed: ESOPHAGOGASTRODUODENOSCOPY (EGD) WITH PROPOFOL (N/A )  Patient Location: PACU  Anesthesia Type:MAC  Level of Consciousness: sedated  Airway & Oxygen Therapy: Patient Spontanous Breathing and Patient connected to nasal cannula oxygen  Post-op Assessment: Report given to RN and Post -op Vital signs reviewed and stable  Post vital signs: Reviewed and stable  Last Vitals:  Vitals:   01/08/18 0849 01/08/18 1149  BP: 140/70 (!) 160/66  Pulse: 77 77  Resp:  15  Temp:  36.7 C  SpO2:  100%    Last Pain:  Vitals:   01/08/18 1149  TempSrc: Oral  PainSc:          Complications: No apparent anesthesia complications

## 2018-01-08 NOTE — Anesthesia Preprocedure Evaluation (Addendum)
Anesthesia Evaluation  Patient identified by MRN, date of birth, ID band Patient awake    Reviewed: Allergy & Precautions, NPO status , Patient's Chart, lab work & pertinent test results, reviewed documented beta blocker date and time   Airway Mallampati: III  TM Distance: >3 FB Neck ROM: Full    Dental   Pulmonary neg pulmonary ROS,    Pulmonary exam normal breath sounds clear to auscultation       Cardiovascular hypertension, Pt. on medications and Pt. on home beta blockers Normal cardiovascular exam Rhythm:Regular Rate:Normal  Echo 10/02/2011 - Left ventricle: The cavity size was normal. Wall thickness was increased in a pattern of severe LVH with prominence of the basal septum. Systolic function was vigorous. The estimated ejection fraction was in the range of 65% to 70%. There was dynamic obstruction, with mid-cavity obliteration. Wall motion was normal; there were no regional wall motion abnormalities. Doppler parameters are consistent with abnormal left ventricular relaxation (grade 1 diastolic dysfunction). - Aortic valve: Valve mobility was mildly restricted. There was mild stenosis. - Atrial septum: A patent foramen ovale cannot be excluded. - Pulmonary arteries: PA peak pressure: 64m Hg (S). - Pericardium, extracardiac: A trivial pericardial effusion was identified.  EKG 01/07/2018- NSR   Neuro/Psych PSYCHIATRIC DISORDERS Poor memorynegative neurological ROS     GI/Hepatic Neg liver ROS, Fecal Occult blood + Gi Bleed   Endo/Other  Hyperlipidemia  Renal/GU negative Renal ROS   BPH    Musculoskeletal negative musculoskeletal ROS (+)   Abdominal   Peds  Hematology  (+) anemia ,   Anesthesia Other Findings   Reproductive/Obstetrics                            Anesthesia Physical Anesthesia Plan  ASA: III  Anesthesia Plan: MAC   Post-op Pain  Management:    Induction: Intravenous  PONV Risk Score and Plan: 1 and Ondansetron, Propofol infusion and Treatment may vary due to age or medical condition  Airway Management Planned: Natural Airway and Nasal Cannula  Additional Equipment:   Intra-op Plan:   Post-operative Plan:   Informed Consent: I have reviewed the patients History and Physical, chart, labs and discussed the procedure including the risks, benefits and alternatives for the proposed anesthesia with the patient or authorized representative who has indicated his/her understanding and acceptance.   Dental advisory given  Plan Discussed with: CRNA, Anesthesiologist and Surgeon  Anesthesia Plan Comments:         Anesthesia Quick Evaluation

## 2018-01-08 NOTE — Anesthesia Postprocedure Evaluation (Signed)
Anesthesia Post Note  Patient: Marc [name]  Procedure(s) Performed: ESOPHAGOGASTRODUODENOSCOPY (EGD) WITH PROPOFOL (N/A )     Patient location during evaluation: PACU Anesthesia Type: MAC Level of consciousness: awake and alert Pain management: pain level controlled Vital Signs Assessment: post-procedure vital signs reviewed and stable Respiratory status: spontaneous breathing, nonlabored ventilation and respiratory function stable Cardiovascular status: stable and blood pressure returned to baseline Postop Assessment: no apparent nausea or vomiting Anesthetic complications: no    Last Vitals:  Vitals:   01/08/18 1149 01/08/18 1231  BP: (!) 160/66 (!) 99/43  Pulse: 77 68  Resp: 15 19  Temp: 36.7 C   SpO2: 100% 100%    Last Pain:  Vitals:   01/08/18 1149  TempSrc: Oral  PainSc:                  [place] Devin A.

## 2018-01-09 ENCOUNTER — Encounter (HOSPITAL_COMMUNITY): Payer: Self-pay | Admitting: Emergency Medicine

## 2018-01-09 ENCOUNTER — Inpatient Hospital Stay (HOSPITAL_COMMUNITY): Payer: Medicare HMO | Admitting: Certified Registered Nurse Anesthetist

## 2018-01-09 ENCOUNTER — Encounter (HOSPITAL_COMMUNITY)
Admission: EM | Disposition: A | Discharge status: Home / Self Care | Payer: Self-pay | Source: Home / Self Care | Attending: Internal Medicine

## 2018-01-09 DIAGNOSIS — D62 Acute posthemorrhagic anemia: Secondary | ICD-10-CM

## 2018-01-09 HISTORY — PX: COLONOSCOPY WITH PROPOFOL: SHX5780

## 2018-01-09 LAB — TYPE AND SCREEN
ABO/RH(D): B POS
Antibody Screen: NEGATIVE
UNIT DIVISION: 0
Unit division: 0

## 2018-01-09 LAB — CBC
HCT: 22.5 % — ABNORMAL LOW (ref 39.0–52.0)
Hemoglobin: 7.8 g/dL — ABNORMAL LOW (ref 13.0–17.0)
MCH: 33.6 pg (ref 26.0–34.0)
MCHC: 34.7 g/dL (ref 30.0–36.0)
MCV: 97 fL (ref 78.0–100.0)
PLATELETS: 143 10*3/uL — AB (ref 150–400)
RBC: 2.32 MIL/uL — ABNORMAL LOW (ref 4.22–5.81)
RDW: 15.7 % — AB (ref 11.5–15.5)
WBC: 7 10*3/uL (ref 4.0–10.5)

## 2018-01-09 LAB — BPAM RBC
BLOOD PRODUCT EXPIRATION DATE: 201903052359
Blood Product Expiration Date: 201903032359
ISSUE DATE / TIME: 201902131518
Unit Type and Rh: 7300
Unit Type and Rh: 7300

## 2018-01-09 LAB — PREPARE RBC (CROSSMATCH)

## 2018-01-09 SURGERY — COLONOSCOPY WITH PROPOFOL
Anesthesia: Monitor Anesthesia Care

## 2018-01-09 MED ORDER — PROPOFOL 10 MG/ML IV BOLUS
INTRAVENOUS | Status: DC | PRN
  Administered 2018-01-09 (×3): 20 mg via INTRAVENOUS
  Administered 2018-01-09: 30 mg via INTRAVENOUS
  Administered 2018-01-09: 20 mg via INTRAVENOUS

## 2018-01-09 MED ORDER — PROPOFOL 10 MG/ML IV BOLUS
INTRAVENOUS | Status: AC
  Filled 2018-01-09: qty 40

## 2018-01-09 MED ORDER — PROPOFOL 500 MG/50ML IV EMUL
INTRAVENOUS | Status: DC | PRN
  Administered 2018-01-09: 50 ug/kg/min via INTRAVENOUS

## 2018-01-09 MED ORDER — SODIUM CHLORIDE 0.9 % IV SOLN: Freq: Once | INTRAVENOUS | Status: DC

## 2018-01-09 MED ORDER — ONDANSETRON HCL 4 MG/2ML IJ SOLN
INTRAMUSCULAR | Status: DC | PRN
  Administered 2018-01-09: 4 mg via INTRAVENOUS

## 2018-01-09 MED ORDER — LIDOCAINE 2% (20 MG/ML) 5 ML SYRINGE
INTRAMUSCULAR | Status: DC | PRN
  Administered 2018-01-09: 50 mg via INTRAVENOUS

## 2018-01-09 MED ORDER — HYDROCORTISONE ACETATE 25 MG RE SUPP
25.0000 mg | Freq: Every day | RECTAL | Status: DC
  Administered 2018-01-09: 25 mg via RECTAL
  Filled 2018-01-09: qty 1

## 2018-01-09 MED ORDER — POTASSIUM CHLORIDE 10 MEQ/100ML IV SOLN
10.0000 meq | INTRAVENOUS | Status: AC
  Administered 2018-01-09 (×2): 10 meq via INTRAVENOUS
  Filled 2018-01-09 (×2): qty 100

## 2018-01-09 MED ORDER — LACTATED RINGERS IV SOLN
INTRAVENOUS | Status: DC
  Administered 2018-01-09: 10:00:00 via INTRAVENOUS

## 2018-01-09 MED ORDER — PHENYLEPHRINE 40 MCG/ML (10ML) SYRINGE FOR IV PUSH (FOR BLOOD PRESSURE SUPPORT)
PREFILLED_SYRINGE | INTRAVENOUS | Status: DC | PRN
  Administered 2018-01-09: 80 ug via INTRAVENOUS

## 2018-01-09 SURGICAL SUPPLY — 21 items

## 2018-01-09 NOTE — Progress Notes (Signed)
[facility]astro[facility]ology Progress Note  Marc [name] 77 y.o. 07/02/1941  CC:  Anemia, occult blood positive stool   Subjective: No active GI issues. Son at bedside. Has not completed his prep and but he is having clear bowel movements according to nursing staff.   Objective: Vital signs in last 24 hours: Vitals:   01/09/18 0900 01/09/18 1000  BP: (!) 133/52 (!) 158/64  Pulse: 83 83  Resp: 16 16  Temp: (!) 97 F (36.1 C) 98.4 F (36.9 C)  SpO2: 99% 100%    Physical Exam:  Gen. Alert and oriented 3. Not in acute distress Abdomen. Soft, nontender, nondistended, bowel sounds present.  Lab Results: Recent Labs    01/08/18 0606 01/08/18 1120  NA 139 140  K 3.2* 3.3*  CL 107 111  CO2 21* 23  GLUCOSE 110* 126*  BUN 18 16  CREATININE 1.64* 1.67*  CALCIUM 7.6* 7.6*   Recent Labs    01/07/18 1339  AST 31  ALT 26  ALKPHOS 74  BILITOT 0.7  PROT 6.9  ALBUMIN 3.5   Recent Labs    01/08/18 0606 01/09/18 0629  WBC 8.6 7.0  HGB 7.3* 7.8*  HCT 21.4* 22.5*  MCV 97.7 97.0  PLT 174 143*   No results for input(s): LABPROT, INR in the last 72 hours.    Assessment/Plan:  Symptomatic anemia. No overt bleeding. EGD yesterday showed mild distal esophagitis. No active bleeding - Occult blood positive stool - Macrocytic anemia with MCV of 119. Iron study shows anemia of chronic disease - Elevated kidney function. ?? Chronic kidney insufficiency - Dementia  Recommendations -------------------------- - Colonoscopy today. Risks (bleeding, infection, bowel perforation that could require surgery, sedation-related changes in cardiopulmonary systems), benefits (identification and possible treatment of source of symptoms, exclusion of certain causes of symptoms), and alternatives (watchful waiting, radiographic imaging studies, empiric medical treatment)  were explained to patient and family in detail and patient wishes to proceed.     Otis Brace MD, [place] 01/09/2018,  10:46 AM  Contact #  [phone]

## 2018-01-09 NOTE — Progress Notes (Signed)
After unit of blood was released, blood bank bracelet noticed, blood bank notified.  Order placed to type and screen, again.  Son and patient aware.   Virginia Rochester, RN

## 2018-01-09 NOTE — Brief Op Note (Signed)
01/07/2018 - 01/09/2018  11:48 AM  PATIENT:  Marc [name]  77 y.o. male  PRE-OPERATIVE DIAGNOSIS:  Anemia, occult blood positive stool  POST-OPERATIVE DIAGNOSIS:  ascending colon polyp removed using cold snare, rectal biopsy, inflammation of rectum   PROCEDURE:  Procedure(s): COLONOSCOPY WITH PROPOFOL (N/A)  SURGEON:  Surgeon(s) and Role:    * Donnita Farina, MD - Primary  Findings --------- - Colonoscopy today showed somewhat fair prep particularly in the right colon, small ascending colon polyp, friable mucosa in the rectum with contact was in the blood as well as internal hemorrhoids with possible evidence of recent bleeding  Recommendations ------------------------- - Start soft diet - Observed 1 more day with repeat CBC tomorrow - repeat colonoscopy may be in 2-3 years because of fair prep based on pathology,  - Start Anusol suppository at night - GI will follow  Otis Brace MD, [place] 01/09/2018, 11:49 AM  Contact #  [phone]

## 2018-01-09 NOTE — Anesthesia Postprocedure Evaluation (Signed)
Anesthesia Post Note  Patient: Marc [name]  Procedure(s) Performed: COLONOSCOPY WITH PROPOFOL (N/A )     Patient location during evaluation: PACU Anesthesia Type: MAC Level of consciousness: awake and alert Pain management: pain level controlled Vital Signs Assessment: post-procedure vital signs reviewed and stable Respiratory status: spontaneous breathing, nonlabored ventilation, respiratory function stable and patient connected to nasal cannula oxygen Cardiovascular status: stable and blood pressure returned to baseline Postop Assessment: no apparent nausea or vomiting Anesthetic complications: no    Last Vitals:  Vitals:   01/09/18 1150 01/09/18 1300  BP: (!) 130/58 (!) 147/60  Pulse: 79 73  Resp: (!) 22 16  Temp:  36.9 C  SpO2: 100% 100%    Last Pain:  Vitals:   01/09/18 1300  TempSrc: Oral  PainSc:                  Annabel Gibeau DAVID

## 2018-01-09 NOTE — Anesthesia Preprocedure Evaluation (Signed)
Anesthesia Evaluation  Patient identified by MRN, date of birth, ID band Patient awake    Reviewed: Allergy & Precautions, NPO status , Patient's Chart, lab work & pertinent test results  Airway Mallampati: I  TM Distance: >3 FB Neck ROM: Full    Dental   Pulmonary    Pulmonary exam normal        Cardiovascular hypertension, Pt. on medications Normal cardiovascular exam     Neuro/Psych Dementia   GI/Hepatic   Endo/Other    Renal/GU      Musculoskeletal   Abdominal   Peds  Hematology   Anesthesia Other Findings   Reproductive/Obstetrics                             Anesthesia Physical Anesthesia Plan  ASA: III  Anesthesia Plan: MAC   Post-op Pain Management:    Induction: Intravenous  PONV Risk Score and Plan: 1  Airway Management Planned: Simple Face Mask  Additional Equipment:   Intra-op Plan:   Post-operative Plan:   Informed Consent: I have reviewed the patients History and Physical, chart, labs and discussed the procedure including the risks, benefits and alternatives for the proposed anesthesia with the patient or authorized representative who has indicated his/her understanding and acceptance.     Plan Discussed with: CRNA and Surgeon  Anesthesia Plan Comments:         Anesthesia Quick Evaluation

## 2018-01-09 NOTE — Transfer of Care (Signed)
Immediate Anesthesia Transfer of Care Note  Patient: Alondra Sahni  Procedure(s) Performed: Procedure(s): COLONOSCOPY WITH PROPOFOL (N/A)  Patient Location: PACU  Anesthesia Type:MAC  Level of Consciousness: Patient easily awoken, sedated, comfortable, cooperative, following commands, responds to stimulation.   Airway & Oxygen Therapy: Patient spontaneously breathing, ventilating well, oxygen via simple oxygen mask.  Post-op Assessment: Report given to PACU RN, vital signs reviewed and stable, moving all extremities.   Post vital signs: Reviewed and stable.  Complications: No apparent anesthesia complications   Last Vitals:  Vitals:   01/09/18 0900 01/09/18 1000  BP: (!) 133/52 (!) 158/64  Pulse: 83 83  Resp: 16 16  Temp: (!) 36.1 C 36.9 C  SpO2: 99% 100%    Last Pain:  Vitals:   01/09/18 1000  TempSrc: Oral  PainSc:       Patients Stated Pain Goal: 2 (85/88/50 2774)  Complications: No apparent anesthesia complications

## 2018-01-09 NOTE — Op Note (Signed)
[facility] Patient Name: Marc [name] Procedure Date: 01/09/2018 MRN: [id number] Attending MD: Otis Brace , MD Date of Birth: 06/20/1941 CSN: [id number] Age: 77 Admit Type: Inpatient Procedure:                Colonoscopy Indications:              Heme positive stool Providers:                Otis Brace, MD, Angus Seller, Tinnie Gens,                            Technician, Heide Scales, CRNA Referring MD:              Medicines:                Sedation Administered by an Anesthesia Professional Complications:            No immediate complications. Estimated Blood Loss:     Estimated blood loss was minimal. Procedure:                Pre-Anesthesia Assessment:                           - Prior to the procedure, a History and Physical                            was performed, and patient medications and                            allergies were reviewed. The patient's tolerance of                            previous anesthesia was also reviewed. The risks                            and benefits of the procedure and the sedation                            options and risks were discussed with the patient.                            All questions were answered, and informed consent                            was obtained. Prior Anticoagulants: The patient has                            taken aspirin, last dose was 3 days prior to                            procedure. ASA Grade Assessment: III - A patient                            with severe systemic disease. After reviewing the  risks and benefits, the patient was deemed in                            satisfactory condition to undergo the procedure.                           After obtaining informed consent, the colonoscope                            was passed under direct vision. Throughout the                            procedure, the patient's blood pressure, pulse, and                     oxygen saturations were monitored continuously. The                            EC-[id number] ([id number]) scope was introduced through                            the anus and advanced to the the terminal ileum,                            with identification of the appendiceal orifice and                            IC valve. The colonoscopy was performed without                            difficulty. The patient tolerated the procedure                            well. The quality of the bowel preparation was                            fair. The terminal ileum, ileocecal valve,                            appendiceal orifice, and rectum were photographed.                            The quality of the bowel preparation was fair. Scope In: 11:05:24 AM Scope Out: 11:27:50 AM Scope Withdrawal Time: 0 hours 13 minutes 5 seconds  Total Procedure Duration: 0 hours 22 minutes 26 seconds  Findings:      The perianal and digital rectal examinations were normal.      A moderate amount of liquid stool was found in the entire colon,       interfering with visualization. Lavage of the area was performed,       resulting in clearance with fair visualization.      The terminal ileum appeared normal.      A 5 mm polyp was found in the ascending colon. The polyp was sessile.       The polyp was removed with a  cold snare. Resection and retrieval were       complete.      There was moderate spasm in the sigmoid colon.      A scattered area of mildly friable mucosa with contact bleeding was       found in the rectum. Biopsies were taken with a cold forceps for       histology.      Internal hemorrhoids were found during retroflexion. The hemorrhoids       were medium-sized. Patient was not able to retain air in the rectum. Impression:               - Preparation of the colon was fair.                           - Stool in the entire examined colon.                           - The examined portion of  the ileum was normal.                           - One 5 mm polyp in the ascending colon, removed                            with a cold snare. Resected and retrieved.                           - Friability with contact bleeding in the rectum.                            Biopsied.                           - Internal hemorrhoids. Moderate Sedation:      Moderate (conscious) sedation was personally administered by an       anesthesia professional. The following parameters were monitored: oxygen       saturation, heart rate, blood pressure, and response to care. Recommendation:           - Return patient to hospital ward for ongoing care.                           - Soft diet.                           - Continue present medications.                           - Await pathology results.                           - Repeat colonoscopy date to be determined after                            pending pathology results are reviewed for                            surveillance based on  pathology results.                            Recommended repeat colonoscopy in 2-3 years because                            of fair prep.                           - Return to my office in 2 months. Procedure Code(s):        --- Professional ---                           [phone], Colonoscopy, flexible; with removal of                            tumor(s), polyp(s), or other lesion(s) by snare                            technique                           45380, 71, Colonoscopy, flexible; with biopsy,                            single or multiple Diagnosis Code(s):        --- Professional ---                           K64.8, Other hemorrhoids                           D12.2, Benign neoplasm of ascending colon                           K62.5, Hemorrhage of anus and rectum                           R19.5, Other fecal abnormalities CPT copyright 2016 [employer]. All rights reserved. The codes documented  in this report are preliminary and upon coder review may  be revised to meet current compliance requirements. Otis Brace, MD Otis Brace, MD 01/09/2018 11:41:06 AM Number of Addenda: 0

## 2018-01-09 NOTE — Anesthesia Procedure Notes (Signed)
Procedure Name: MAC Date/Time: 01/09/2018 10:58 AM Performed by: Deliah Boston, CRNA Pre-anesthesia Checklist: Patient identified, Emergency Drugs available, Suction available, Patient being monitored and Timeout performed Patient Re-evaluated:Patient Re-evaluated prior to induction Oxygen Delivery Method: Simple face mask Placement Confirmation: positive ETCO2,  CO2 detector and breath sounds checked- equal and bilateral

## 2018-01-09 NOTE — Progress Notes (Signed)
PROGRESS NOTE    Marc [name]  STM:[id number] DOB: 01-05-1941 DOA: 01/07/2018 PCP: Patient, No Pcp Per  Brief Narrative: 77 y.o.malewitha history of dementia, HTN, HLD who presented to the ED after being called by urgent care for abnormal labs. History is per son and EMR due to patient's dementia. His son reports more frequent falls, most recently a fall at home 2/7 without significant injury, though the patient was lightheaded, BP taken at home was 70/40, but improve to 110s/60s with time. He denied LOC, attributed it to generalized fatigue/weakness which continued, constant, worsening, until he took his father to urgent care 2/11 for evaluation (has no real PCP). He was discharged to home after labsd were drawn and were called this morning advising he go to the ED for elevated creatinine and low hemoglobin.Denies fever, chills, weight loss, chest pain, palpitations,syncope,shortness of breath, abdominal pain, nausea, vomiting, changes in bowel habits, dark tarry or red stools, change in bladder habits, hematuria, myalgias, arthralgias, and rash.  Egd 2/13-distal esophagitis, biopsies taken from the esophagus as well as the stomach to check for H. pylori.  2 small polyps found in the gastric fundus and gastric body.  Biopsies taken.  Biopsies taken from the duodenum to evaluate for celiac disease.  Patient waiting for colonoscopy 2/14.  He received a unit of blood to 2/13.    Assessment & Plan:   Active Problems:   HTN (hypertension)   Hyperlipidemia   Dementia   BPH (benign prostatic hypertrophy)   Acute blood loss anemia   Symptomatic anemia   GI bleed GI bleed withsymptomaticacute blood loss anemia: + FOBT, orthostasis.  - EGD 2/13 distal esophagitis.  Colonoscopy to be done 2/14. - PPI gtt -  NPO -Hold baby aspirin -Hemoglobin only up to 7.8 after 1 unit of blood transfusion.  Will arrange for 1 more unit of blood transfusion.  HTN with current orthostatic hypotension.  -  Hold HCTZ, lisinopril, metoprolol. Will continue low dose clonidine to avoid rebound.   BPH:  - Given orthostasis, will hold rapaflo, monitor UOP  Hyperlipidemia:  - Continue lipitor  AKI on stage III CKD:  - Monitor BMP, I/O - IVF's  Hypokalemia we will replete.      DVT prophylaxis: SCD Code Status: Full code Family Communication discussed with son Disposition Plan: TBD   Consultants: GI  Procedures: EGD Antimicrobials: None Subjective: Son by the bedside`  Objective: Resting in bed in no acute distress Vitals:   01/08/18 1546 01/08/18 1811 01/08/18 2112 01/09/18 0600  BP: (!) 140/59 (!) 141/71 (!) 141/57 122/64  Pulse: 76 78 81 74  Resp: 16 15 18 16   Temp: 98.3 F (36.8 C) 97.6 F (36.4 C) 98.1 F (36.7 C) 99.1 F (37.3 C)  TempSrc: Axillary Oral Oral Oral  SpO2: 100% 100% 99% 97%  Weight:      Height:        Intake/Output Summary (Last 24 hours) at 01/09/2018 0942 Last data filed at 01/08/2018 1811 Gross per 24 hour  Intake 1495 ml  Output 200 ml  Net 1295 ml   Filed Weights   01/07/18 2019 01/08/18 1149  Weight: 73.5 kg (162 lb 0.6 oz) 73.5 kg (162 lb)    Examination:  General exam: Appears calm and comfortable  Respiratory system: Clear to auscultation. Respiratory effort normal. Cardiovascular system: S1 & S2 heard, RRR. No JVD, murmurs, rubs, gallops or clicks. No pedal edema. Gastrointestinal system: Abdomen is nondistended, soft and nontender. No organomegaly or masses felt. Normal  bowel sounds heard. Central nervous system: Alert and oriented. No focal neurological deficits. Extremities: Symmetric 5 x 5 power. Skin: No rashes, lesions or ulcers Psychiatry: Judgement and insight appear normal. Mood & affect appropriate.     Data Reviewed: I have personally reviewed following labs and imaging studies  CBC: Recent Labs  Lab 01/07/18 1339 01/07/18 1540 01/08/18 0606 01/09/18 0629  WBC 10.9*  --  8.6 7.0  HGB 8.6*  --  7.3*  7.8*  HCT 30.9* 22.5* 21.4* 22.5*  MCV 119.3*  --  97.7 97.0  PLT 201  --  174 841*   Basic Metabolic Panel: Recent Labs  Lab 01/07/18 1339 01/08/18 0606 01/08/18 1120  NA 136 139 140  K 3.4* 3.2* 3.3*  CL 102 107 111  CO2 22 21* 23  GLUCOSE 147* 110* 126*  BUN 25* 18 16  CREATININE 1.79* 1.64* 1.67*  CALCIUM 8.2* 7.6* 7.6*   GFR: Estimated Creatinine Clearance: 35.2 mL/min (A) (by C-G formula based on SCr of 1.67 mg/dL (H)). Liver Function Tests: Recent Labs  Lab 01/07/18 1339  AST 31  ALT 26  ALKPHOS 74  BILITOT 0.7  PROT 6.9  ALBUMIN 3.5   No results for input(s): LIPASE, AMYLASE in the last 168 hours. Recent Labs  Lab 01/07/18 1343  AMMONIA 34   Coagulation Profile: No results for input(s): INR, PROTIME in the last 168 hours. Cardiac Enzymes: Recent Labs  Lab 01/07/18 1343  TROPONINI <0.03   BNP (last 3 results) No results for input(s): PROBNP in the last 8760 hours. HbA1C: No results for input(s): HGBA1C in the last 72 hours. CBG: Recent Labs  Lab 01/07/18 1351  GLUCAP 153*   Lipid Profile: No results for input(s): CHOL, HDL, LDLCALC, TRIG, CHOLHDL, LDLDIRECT in the last 72 hours. Thyroid Function Tests: No results for input(s): TSH, T4TOTAL, FREET4, T3FREE, THYROIDAB in the last 72 hours. Anemia Panel: Recent Labs    01/07/18 1504 01/07/18 1540 01/08/18 0606  VITAMINB12  --  684 536  FOLATE  --   --  15.3  FERRITIN 148  --  140  TIBC 242*  --  227*  IRON 28*  --  35*  RETICCTPCT  --   --  9.8*   Sepsis Labs: No results for input(s): PROCALCITON, LATICACIDVEN in the last 168 hours.  No results found for this or any previous visit (from the past 240 hour(s)).       Radiology Studies: Dg Chest 2 View  Result Date: 01/07/2018 CLINICAL DATA:  Weakness and fatigue, memory loss for the past week. Patient found have decreased renal function and anemia. History of hypertension EXAM: CHEST  2 VIEW COMPARISON:  Chest x-ray of September 26, 2011 FINDINGS: The lungs are reasonably well inflated and clear. The heart and pulmonary vascularity are normal. There is no pleural effusion. There calcification in the wall of the thoracic aorta. There mild multilevel degenerative disc disease of the thoracic spine. IMPRESSION: There is no pneumonia, CHF, nor other acute cardiopulmonary abnormality. Thoracic aortic atherosclerosis. Electronically Signed   By: David  Martinique M.D.   On: 01/07/2018 14:22   Ct Head Wo Contrast  Result Date: 01/07/2018 CLINICAL DATA:  Altered mental status. EXAM: CT HEAD WITHOUT CONTRAST TECHNIQUE: Contiguous axial images were obtained from the base of the skull through the vertex without intravenous contrast. COMPARISON:  CT head dated September 29, 2011. FINDINGS: Brain: No evidence of acute infarction, hemorrhage, hydrocephalus, extra-axial collection or mass lesion/mass effect. Stable cerebral atrophy  and chronic microvascular ischemic changes. Unchanged lacunar infarcts in the bilateral basal ganglia and right thalamus. Vascular: Atherosclerotic vascular calcification of the carotid siphons. No hyperdense vessel. Skull: Normal. Negative for fracture or focal lesion. Sinuses/Orbits: No acute finding. Other: None. IMPRESSION: 1. No acute intracranial abnormality. Stable cerebral atrophy and chronic microvascular ischemic changes. Electronically Signed   By: Titus Dubin M.D.   On: 01/07/2018 13:36        Scheduled Meds: . atorvastatin  20 mg Oral q1800  . cloNIDine  0.1 mg Oral Daily  . donepezil  5 mg Oral QHS   Continuous Infusions: . sodium chloride 125 mL/hr at 01/08/18 1652  . sodium chloride    . sodium chloride    . sodium chloride    . sodium chloride    . pantoprozole (PROTONIX) infusion 8 mg/hr (01/08/18 2247)     LOS: 2 days      Georgette Shell, MD [facility]  If 7PM-7AM, please contact night-coverage [email or web address] Password [facility] 01/09/2018, 9:42 AM

## 2018-01-10 ENCOUNTER — Encounter (HOSPITAL_COMMUNITY): Payer: Self-pay | Admitting: Gastroenterology

## 2018-01-10 DIAGNOSIS — N183 Chronic kidney disease, stage 3 (moderate): Secondary | ICD-10-CM

## 2018-01-10 LAB — BASIC METABOLIC PANEL
Anion gap: 7 (ref 5–15)
BUN: 11 mg/dL (ref 6–20)
CHLORIDE: 113 mmol/L — AB (ref 101–111)
CO2: 18 mmol/L — ABNORMAL LOW (ref 22–32)
Calcium: 7.6 mg/dL — ABNORMAL LOW (ref 8.9–10.3)
Creatinine, Ser: 1.47 mg/dL — ABNORMAL HIGH (ref 0.61–1.24)
GFR calc Af Amer: 52 mL/min — ABNORMAL LOW (ref >60)
GFR calc non Af Amer: 45 mL/min — ABNORMAL LOW (ref >60)
GLUCOSE: 92 mg/dL (ref 65–99)
POTASSIUM: 3.7 mmol/L (ref 3.5–5.1)
Sodium: 138 mmol/L (ref 135–145)

## 2018-01-10 LAB — CBC
HCT: 27.5 % — ABNORMAL LOW (ref 39.0–52.0)
Hemoglobin: 9.5 g/dL — ABNORMAL LOW (ref 13.0–17.0)
MCH: 33 pg (ref 26.0–34.0)
MCHC: 34.5 g/dL (ref 30.0–36.0)
MCV: 95.5 fL (ref 78.0–100.0)
PLATELETS: 151 10*3/uL (ref 150–400)
RBC: 2.88 MIL/uL — AB (ref 4.22–5.81)
RDW: 16 % — ABNORMAL HIGH (ref 11.5–15.5)
WBC: 9.1 10*3/uL (ref 4.0–10.5)

## 2018-01-10 LAB — TYPE AND SCREEN
ABO/RH(D): B POS
Antibody Screen: NEGATIVE
Unit division: 0

## 2018-01-10 LAB — BPAM RBC
BLOOD PRODUCT EXPIRATION DATE: 201903052359
ISSUE DATE / TIME: 201902141704
Unit Type and Rh: 7300

## 2018-01-10 MED ORDER — HYDROCORTISONE ACETATE 25 MG RE SUPP: 25.0000 mg | Freq: Every day | RECTAL | 0 refills | Status: DC

## 2018-01-10 MED ORDER — PANTOPRAZOLE SODIUM 40 MG PO TBEC: 40.0000 mg | DELAYED_RELEASE_TABLET | Freq: Every day | ORAL | Status: DC

## 2018-01-10 MED ORDER — PANTOPRAZOLE SODIUM 40 MG PO TBEC: 40.0000 mg | DELAYED_RELEASE_TABLET | Freq: Every day | ORAL | 1 refills | Status: DC

## 2018-01-10 MED ORDER — HYDROCORTISONE 2.5 % RE CREA: TOPICAL_CREAM | RECTAL | 1 refills | Status: DC

## 2018-01-10 MED ORDER — PANTOPRAZOLE SODIUM 40 MG PO TBEC: 40.0000 mg | DELAYED_RELEASE_TABLET | Freq: Two times a day (BID) | ORAL | 0 refills | Status: DC

## 2018-01-10 NOTE — Telephone Encounter (Signed)
Per son, patient was discharged today from [facility] and was told to stop taking 81 aspirin, lisinopril and metoprolol. Per son, he was told to contact the office to determine when or if patient needed to restart these medication. Son stated that he was concerned that his BP may get too high while off these medications. Son advised to follow the discharge instructions that were given to him today and that he would be evaluated in the office to determine if he needs to restart these medications on 01/24/18. Son advised to monitor patient's blood pressure until then and contact our office for an appointment if his blood pressure starts to increase. Son verbalized understanding of plan.

## 2018-01-10 NOTE — Discharge Summary (Signed)
Physician Discharge Summary  Marc [name] IOE:[id number] DOB: 1941/04/10 DOA: 01/07/2018  PCP: Patient, No Pcp Per  Admit date: 01/07/2018 Discharge date: 01/10/2018  Admitted From: Home Disposition: Home  Recommendations for Outpatient Follow-up:  1. Follow up with PCP in 1-2 weeks 2. Please obtain BMP/CBC in one week 3. Follow-up with GI in 1-2 weeks DR [place]: None  equipment/Devices: None Discharge Condition: Stable CODE STATUS: Full Diet recommendation: Cardiac  Brief/Interim Summary:77 y.o.malewitha history of dementia, HTN, HLD who presented to the ED after being called by urgent care for abnormal labs. History is per son and EMR due to patient's dementia. His son reports more frequent falls, most recently a fall at home 2/7 without significant injury, though the patient was lightheaded, BP taken at home was 70/40, but improve to 110s/60s with time. He denied LOC, attributed it to generalized fatigue/weakness which continued, constant, worsening, until he took his father to urgent care 2/11 for evaluation (has no real PCP). He was discharged to home after labsd were drawn and were called this morning advising he go to the ED for elevated creatinine and low hemoglobin.Denies fever, chills, weight loss, chest pain, palpitations,syncope,shortness of breath, abdominal pain, nausea, vomiting, changes in bowel habits, dark tarry or red stools, change in bladder habits, hematuria, myalgias, arthralgias, and rash.  Egd 2/13-distal esophagitis, biopsies taken from the esophagus as well as the stomach to check for H. pylori.  2 small polyps found in the gastric fundus and gastric body.  Biopsies taken.  Biopsies taken from the duodenum to evaluate for celiac disease .  He received a unit of blood to 2/13.  Patient had colonoscopy 2/14 /19 -he had small ascending colon polyp friable rectal mucosa and internal hemorrhoids with evidence of recent bleeding.    Discharge  Diagnoses:  Active Problems:   HTN (hypertension)   Hyperlipidemia   Dementia   BPH (benign prostatic hypertrophy)   Acute blood loss anemia   Symptomatic anemia   GI bleed  GI bleed withsymptomaticacute blood loss anemia: + FOBT, orthostasis.  - EGD 2/13 distal esophagitis.  Colonoscopy DONE 2/14 as above. - PPI qd -Hold baby aspirin -Status post 2 units of blood transfusion hemoglobin stable. HTN with current orthostatic hypotension.  - Hold HCTZ, lisinopril, metoprolol. Will continue low dose clonidine to avoid rebound.   BPH:  Continue Rapaflo  Hyperlipidemia:  - Continue lipitor  AKI on stage III CKD: Stable   Hypokalemia resolved   Discharge Instructions  Discharge Instructions    Call MD for:  extreme fatigue   Complete by:  As directed    Call MD for:  persistant dizziness or light-headedness   Complete by:  As directed    Call MD for:  persistant nausea and vomiting   Complete by:  As directed    Call MD for:  temperature >100.4   Complete by:  As directed    Diet - low sodium heart healthy   Complete by:  As directed    Increase activity slowly   Complete by:  As directed      Allergies as of 01/10/2018   No Known Allergies     Medication List    STOP taking these medications   aspirin 81 MG tablet   lisinopril 10 MG tablet Commonly known as:  PRINIVIL,ZESTRIL   metoprolol tartrate 25 MG tablet Commonly known as:  LOPRESSOR     TAKE these medications   atorvastatin 20 MG tablet Commonly known as:  LIPITOR  Take 1 tablet (20 mg total) by mouth daily.   cloNIDine 0.1 MG tablet Commonly known as:  CATAPRES Take 1 tablet (0.1 mg total) by mouth daily.   donepezil 5 MG tablet Commonly known as:  ARICEPT TAKE 1 TABLET(5 MG) BY MOUTH AT BEDTIME   hydrochlorothiazide 25 MG tablet Commonly known as:  HYDRODIURIL Take 1 tablet (25 mg total) by mouth daily.   hydrocortisone 2.5 % rectal cream Commonly known as:  ANUSOL-HC Apply  rectally 2 times daily   hydrocortisone 25 MG suppository Commonly known as:  ANUSOL-HC Place 1 suppository (25 mg total) rectally at bedtime.   pantoprazole 40 MG tablet Commonly known as:  PROTONIX Take 1 tablet (40 mg total) by mouth daily.   silodosin 8 MG Caps capsule Commonly known as:  RAPAFLO Take 1 capsule (8 mg total) by mouth daily. NEED OV.      Follow-up Information    Brahmbhatt, Parag, MD. Schedule an appointment as soon as possible for a visit in 6 week(s).   Specialty:  Gastroenterology Why:   needs follow-up for esophagitis and anemia. Contact information: [address] [place] [address] [phone]        Nelva Bush, MD Follow up.   Specialty:  Cardiology Contact information: [place] [address] [phone]          No Known Allergies  Consultations: Procedures/Studies: Dg Chest 2 View  Result Date: 01/07/2018 CLINICAL DATA:  Weakness and fatigue, memory loss for the past week. Patient found have decreased renal function and anemia. History of hypertension EXAM: CHEST  2 VIEW COMPARISON:  Chest x-ray of September 26, 2011 FINDINGS: The lungs are reasonably well inflated and clear. The heart and pulmonary vascularity are normal. There is no pleural effusion. There calcification in the wall of the thoracic aorta. There mild multilevel degenerative disc disease of the thoracic spine. IMPRESSION: There is no pneumonia, CHF, nor other acute cardiopulmonary abnormality. Thoracic aortic atherosclerosis. Electronically Signed   By: David  Martinique M.D.   On: 01/07/2018 14:22   Ct Head Wo Contrast  Result Date: 01/07/2018 CLINICAL DATA:  Altered mental status. EXAM: CT HEAD WITHOUT CONTRAST TECHNIQUE: Contiguous axial images were obtained from the base of the skull through the vertex without intravenous contrast. COMPARISON:  CT head dated September 29, 2011. FINDINGS: Brain: No evidence of acute infarction,  hemorrhage, hydrocephalus, extra-axial collection or mass lesion/mass effect. Stable cerebral atrophy and chronic microvascular ischemic changes. Unchanged lacunar infarcts in the bilateral basal ganglia and right thalamus. Vascular: Atherosclerotic vascular calcification of the carotid siphons. No hyperdense vessel. Skull: Normal. Negative for fracture or focal lesion. Sinuses/Orbits: No acute finding. Other: None. IMPRESSION: 1. No acute intracranial abnormality. Stable cerebral atrophy and chronic microvascular ischemic changes. Electronically Signed   By: Titus Dubin M.D.   On: 01/07/2018 13:36    (Echo, Carotid, EGD, Colonoscopy, ERCP)    Subjective:   Discharge Exam: Vitals:   01/09/18 1950 01/10/18 0505  BP: (!) 155/56 (!) 167/67  Pulse: 84 91  Resp: 16 18  Temp: 98.4 F (36.9 C) 98.6 F (37 C)  SpO2: 98% 99%   Vitals:   01/09/18 1717 01/09/18 1732 01/09/18 1950 01/10/18 0505  BP: (!) 165/58 (!) 150/61 (!) 155/56 (!) 167/67  Pulse: 83 95 84 91  Resp: 16  16 18   Temp: 98.2 F (36.8 C) 98 F (36.7 C) 98.4 F (36.9 C) 98.6 F (37 C)  TempSrc:  Oral  Oral Axillary  SpO2: 100% 100% 98% 99%  Weight:      Height:        General: Pt is alert, awake, not in acute distress Cardiovascular: RRR, S1/S2 +, no rubs, no gallops Respiratory: CTA bilaterally, no wheezing, no rhonchi Abdominal: Soft, NT, ND, bowel sounds + Extremities: no edema, no cyanosis    The results of significant diagnostics from this hospitalization (including imaging, microbiology, ancillary and laboratory) are listed below for reference.     Microbiology: No results found for this or any previous visit (from the past 240 hour(s)).   Labs: BNP (last 3 results) No results for input(s): BNP in the last 8760 hours. Basic Metabolic Panel: Recent Labs  Lab 01/07/18 1339 01/08/18 0606 01/08/18 1120 01/10/18 0609  NA 136 139 140 138  K 3.4* 3.2* 3.3* 3.7  CL 102 107 111 113*  CO2 22 21* 23 18*   GLUCOSE 147* 110* 126* 92  BUN 25* 18 16 11   CREATININE 1.79* 1.64* 1.67* 1.47*  CALCIUM 8.2* 7.6* 7.6* 7.6*   Liver Function Tests: Recent Labs  Lab 01/07/18 1339  AST 31  ALT 26  ALKPHOS 74  BILITOT 0.7  PROT 6.9  ALBUMIN 3.5   No results for input(s): LIPASE, AMYLASE in the last 168 hours. Recent Labs  Lab 01/07/18 1343  AMMONIA 34   CBC: Recent Labs  Lab 01/07/18 1339 01/07/18 1540 01/08/18 0606 01/09/18 0629 01/10/18 0609  WBC 10.9*  --  8.6 7.0 9.1  HGB 8.6*  --  7.3* 7.8* 9.5*  HCT 30.9* 22.5* 21.4* 22.5* 27.5*  MCV 119.3*  --  97.7 97.0 95.5  PLT 201  --  174 143* 151   Cardiac Enzymes: Recent Labs  Lab 01/07/18 1343  TROPONINI <0.03   BNP: Invalid input(s): POCBNP CBG: Recent Labs  Lab 01/07/18 1351  GLUCAP 153*   D-Dimer No results for input(s): DDIMER in the last 72 hours. Hgb A1c No results for input(s): HGBA1C in the last 72 hours. Lipid Profile No results for input(s): CHOL, HDL, LDLCALC, TRIG, CHOLHDL, LDLDIRECT in the last 72 hours. Thyroid function studies No results for input(s): TSH, T4TOTAL, T3FREE, THYROIDAB in the last 72 hours.  Invalid input(s): FREET3 Anemia work up Recent Labs    01/07/18 1504 01/07/18 1540 01/08/18 0606  VITAMINB12  --  684 536  FOLATE  --   --  15.3  FERRITIN 148  --  140  TIBC 242*  --  227*  IRON 28*  --  35*  RETICCTPCT  --   --  9.8*   Urinalysis    Component Value Date/Time   COLORURINE YELLOW 01/07/2018 [place] 01/07/2018 1714   LABSPEC 1.012 01/07/2018 1714   PHURINE 5.0 01/07/2018 1714   GLUCOSEU NEGATIVE 01/07/2018 1714   HGBUR NEGATIVE 01/07/2018 1714   BILIRUBINUR NEGATIVE 01/07/2018 1714   KETONESUR NEGATIVE 01/07/2018 1714   PROTEINUR NEGATIVE 01/07/2018 1714   UROBILINOGEN 0.2 09/26/2011 2053   NITRITE NEGATIVE 01/07/2018 1714   LEUKOCYTESUR NEGATIVE 01/07/2018 1714   Sepsis Labs Invalid input(s): PROCALCITONIN,  WBC,  LACTICIDVEN Microbiology No  results found for this or any previous visit (from the past 240 hour(s)).   Time coordinating discharge: Over 30 minutes  SIGNED:   Georgette Shell, MD  [facility] 01/10/2018, 1:26 PM Pager   If 7PM-7AM, please contact night-coverage [email or web address] Password [email or web address]

## 2018-01-10 NOTE — Progress Notes (Signed)
[facility] Gastroenterology Progress Note  Marc [name] 77 y.o. May 30, 1941  CC:  Anemia, occult blood positive stool   Subjective: No active GI issues. Son at bedside. Has not completed his prep and but he is having clear bowel movements according to nursing staff.   Objective: Vital signs in last 24 hours: Vitals:   01/09/18 1950 01/10/18 0505  BP: (!) 155/56 (!) 167/67  Pulse: 84 91  Resp: 16 18  Temp: 98.4 F (36.9 C) 98.6 F (37 C)  SpO2: 98% 99%    Physical Exam:  Gen. Alert and oriented 3. Not in acute distress Abdomen. Soft, nontender, nondistended, bowel sounds present.  Lab Results: Recent Labs    01/08/18 1120 01/10/18 0609  NA 140 138  K 3.3* 3.7  CL 111 113*  CO2 23 18*  GLUCOSE 126* 92  BUN 16 11  CREATININE 1.67* 1.47*  CALCIUM 7.6* 7.6*   Recent Labs    01/07/18 1339  AST 31  ALT 26  ALKPHOS 74  BILITOT 0.7  PROT 6.9  ALBUMIN 3.5   Recent Labs    01/09/18 0629 01/10/18 0609  WBC 7.0 9.1  HGB 7.8* 9.5*  HCT 22.5* 27.5*  MCV 97.0 95.5  PLT 143* 151   No results for input(s): LABPROT, INR in the last 72 hours.    Assessment/Plan:  Symptomatic anemia. No overt bleeding. EGD 02/13  showed mild distal esophagitis. No active bleeding. Biopsies negative - Occult blood positive stool. Colonoscopy yesterday showed one small polyp. Pathology showed tubular adenoma. Multiple inflammation in the rectum and hyperemia of hemorrhoids noted. Biopsy showed possible prep-related injury. - Macrocytic anemia with MCV of 119. Iron study shows anemia of chronic disease - Elevated kidney function. ?? Chronic kidney insufficiency - Dementia - Adenomatous polyp  Recommendations -------------------------- - Patient's hemoglobin has been stable. No overt bleeding. Discussed with the son. - Change PPI to by mouth. Continue PPI for next 8 weeks.  - Continue Anusol suppository for 2-4 weeks. - Recommend repeat EGD in 8-12 weeks to document healing of  esophagitis. - Repeat colonoscopy in 2 years because of fair prep - Follow-up in GI clinic in 6 weeks. - Okay to discharge from GI standpoint. GI will sign off. Call us back if needed     Otis Brace MD, [place] 01/10/2018, 12:49 PM  Contact #  [phone]

## 2018-01-10 NOTE — Telephone Encounter (Signed)
F/U Call:  Patient son Marc [name]) calling states that father is in [facility] ([name] hospital and was instructed to call Dr. Saunders Revel office to discuss medications.

## 2018-01-11 ENCOUNTER — Other Ambulatory Visit: Payer: Self-pay | Admitting: Cardiology

## 2018-01-13 NOTE — Telephone Encounter (Signed)
REFILL 

## 2018-01-24 ENCOUNTER — Ambulatory Visit: Payer: Medicare HMO | Admitting: Internal Medicine

## 2018-01-24 ENCOUNTER — Encounter: Payer: Self-pay | Admitting: Internal Medicine

## 2018-01-24 VITALS — BP 140/72 | HR 103 | Ht 67.0 in | Wt 156.0 lb

## 2018-01-24 DIAGNOSIS — R0602 Shortness of breath: Secondary | ICD-10-CM | POA: Diagnosis not present

## 2018-01-24 DIAGNOSIS — I10 Essential (primary) hypertension: Secondary | ICD-10-CM | POA: Diagnosis not present

## 2018-01-24 DIAGNOSIS — E785 Hyperlipidemia, unspecified: Secondary | ICD-10-CM | POA: Diagnosis not present

## 2018-01-24 LAB — CBC
Hematocrit: 35.6 % — ABNORMAL LOW (ref 37.5–51.0)
Hemoglobin: 12 g/dL — ABNORMAL LOW (ref 13.0–17.7)
MCH: 32.3 pg (ref 26.6–33.0)
MCHC: 33.7 g/dL (ref 31.5–35.7)
MCV: 96 fL (ref 79–97)
PLATELETS: 266 10*3/uL (ref 150–379)
RBC: 3.72 x10E6/uL — ABNORMAL LOW (ref 4.14–5.80)
RDW: 14.7 % (ref 12.3–15.4)
WBC: 10 10*3/uL (ref 3.4–10.8)

## 2018-01-24 LAB — LIPID PANEL
Chol/HDL Ratio: 4.1 ratio (ref 0.0–5.0)
Cholesterol, Total: 172 mg/dL (ref 100–199)
HDL: 42 mg/dL (ref >39)
LDL Calculated: 72 mg/dL (ref 0–99)
Triglycerides: 292 mg/dL — ABNORMAL HIGH (ref 0–149)
VLDL Cholesterol Cal: 58 mg/dL — ABNORMAL HIGH (ref 5–40)

## 2018-01-24 MED ORDER — CARVEDILOL 3.125 MG PO TABS: 3.1250 mg | ORAL_TABLET | Freq: Two times a day (BID) | ORAL | 3 refills | Status: DC

## 2018-01-24 NOTE — Progress Notes (Signed)
Follow-up Outpatient Visit Date: 01/24/2018  Primary Care Provider: Patient, No Pcp Per No address on file  Chief Complaint: Follow-up hypertension and hyperlipidemia  HPI:  Marc [name] is a 77 y.o. year-old male with history of hypertension, hyperlipidemia, and severe LVH by echo in 2012, who presents for follow-up of hypertension. He was previously followed by Dr. Mare Ferrari and was last seen in our office a year ago by Lyda Jester, PA. He was doing well at that time with the exception of memory problems.  Today, Marc [name] is without complaints. However, he has advanced dementia and all of the history is provided by his son. He was recently hospitalized for an upper GI bleed requiring PRBC transfusion. He underwent upper and lower endoscopy, which revealed friable rectal mucosa and bleeding internal hemorrhoids. Since leaving the hospital on 01/10/18, Marc [name] has been doing well with the exception of a single episode of shortness of breath. The patient does not recall the event and his son states only that it appeared that his father was breathing harder than usual while walking around the house. There is no report of chest pain, palpitations, lightheadedness or edema. No rectal bleeding has been observed. Marc [name] is scheduled for GI follow-up in about a month. He does not have a PCP.  --------------------------------------------------------------------------------------------------  Past Medical History:  Diagnosis Date  . BPH (benign prostatic hypertrophy)    WITH MICROSCOPIC HEMATURIA  . Hematuria   . Hyperlipidemia   . Hypertension    has severe LVH per echo in November 2012   Past Surgical History:  Procedure Laterality Date  . COLONOSCOPY WITH PROPOFOL N/A 01/09/2018   Procedure: COLONOSCOPY WITH PROPOFOL;  Surgeon: Otis Brace, MD;  Location: [facility];  Service: Gastroenterology;  Laterality: N/A;  . ESOPHAGOGASTRODUODENOSCOPY (EGD) WITH PROPOFOL N/A  01/08/2018   Procedure: ESOPHAGOGASTRODUODENOSCOPY (EGD) WITH PROPOFOL;  Surgeon: Otis Brace, MD;  Location: [facility];  Service: Gastroenterology;  Laterality: N/A;    Current Meds  Medication Sig  . atorvastatin (LIPITOR) 20 MG tablet Take 1 tablet (20 mg total) by mouth daily.  Marland Kitchen donepezil (ARICEPT) 5 MG tablet TAKE 1 TABLET(5 MG) BY MOUTH AT BEDTIME  . hydrochlorothiazide (HYDRODIURIL) 25 MG tablet Take 1 tablet (25 mg total) by mouth daily.  . hydrocortisone (ANUSOL-HC) 2.5 % rectal cream Apply rectally 2 times daily  . hydrocortisone (ANUSOL-HC) 25 MG suppository Place 1 suppository (25 mg total) rectally at bedtime.  . pantoprazole (PROTONIX) 40 MG tablet Take 1 tablet (40 mg total) by mouth daily.  . silodosin (RAPAFLO) 8 MG CAPS capsule TAKE 1 CAPSULE(8 MG) BY MOUTH DAILY  . [DISCONTINUED] cloNIDine (CATAPRES) 0.1 MG tablet Take 1 tablet (0.1 mg total) by mouth daily.    Allergies: Patient has no known allergies.  Social History   Socioeconomic History  . Marital status: Married    Spouse name: Not on file  . Number of children: Not on file  . Years of education: Not on file  . Highest education level: Not on file  Social Needs  . Financial resource strain: Not on file  . Food insecurity - worry: Not on file  . Food insecurity - inability: Not on file  . Transportation needs - medical: Not on file  . Transportation needs - non-medical: Not on file  Occupational History  . Not on file  Tobacco Use  . Smoking status: Never Smoker  . Smokeless tobacco: Never Used  Substance and Sexual Activity  . Alcohol use: Yes  Comment: OCCASSIONAL  . Drug use: No  . Sexual activity: Not on file  Other Topics Concern  . Not on file  Social History Narrative  . Not on file    Family History  Family history unknown: Yes    Review of Systems: A 12-system review of systems was performed and was negative except as noted in the  HPI.  --------------------------------------------------------------------------------------------------  Physical Exam: BP 140/72 (BP Location: Left Arm, Patient Position: Sitting, Cuff Size: Normal)   Pulse (!) 103   Ht 5\' 7"  (1.702 m)   Wt 156 lb (70.8 kg)   SpO2 97%   BMI 24.43 kg/m   General:  Pale, elderly man, seated on the exam table. He is accompanied by his son. HEENT: Mild conjunctival pallor noted. Moist mucous membranes.  OP clear. Neck: Supple without lymphadenopathy, thyromegaly, JVD, or HJR. Lungs: Normal work of breathing. Mildly diminished breath sounds throughout with poor inspiratory effort. Heart: Regular rate and rhythm without murmurs, rubs, or gallops. Non-displaced PMI. Abd: Bowel sounds present. Soft, NT/ND without hepatosplenomegaly Ext: No lower extremity edema. Radial, PT, and DP pulses are 2+ bilaterally. Skin: Warm and dry without rash.  EKG:  NSR without abnormalities.  Lab Results  Component Value Date   WBC 10.0 01/24/2018   HGB 12.0 (L) 01/24/2018   HCT 35.6 (L) 01/24/2018   MCV 96 01/24/2018   PLT 266 01/24/2018    Lab Results  Component Value Date   NA 138 01/10/2018   K 3.7 01/10/2018   CL 113 (H) 01/10/2018   CO2 18 (L) 01/10/2018   BUN 11 01/10/2018   CREATININE 1.47 (H) 01/10/2018   GLUCOSE 92 01/10/2018   ALT 26 01/07/2018    Lab Results  Component Value Date   CHOL 172 01/24/2018   HDL 42 01/24/2018   LDLCALC 72 01/24/2018   TRIG 292 (H) 01/24/2018   CHOLHDL 4.1 01/24/2018    --------------------------------------------------------------------------------------------------  ASSESSMENT AND PLAN: Shortness of breath Only one episode reported by the patient's son. No further history can be provided. Exam and EKG to are unrevealing. Marc [name] does not appear volume overloaded. Worsening anemia is a consideration. We will therefore check a CBC today. We discussed the utility of performing an echo but have agreed to defer  this unless further episodes of dyspnea occur. In addition, the patient is a poor candidate for any invasive testing/procedures, given his advanced dementia (he does not even recognize his son some days). I have completed a temporary handicap placard request, as the patient is unable to ambulate 200 feet without stopping.  Hypertension Blood pressure is mildly elevated today but was low leading up to recent GI bleed. Current blood pressure regimen consists of HCTZ and clonidine (once daily). We have agreed to stop clonidine and add carvedilol 3.125 mg BID.  Hyperlipidemia We will check a lipid panel today and continue current dose of atorvastatin.  GI bleed No further bleeding reported by the patient's son. We will continue to hold aspirin. I will defer further management to GI.  Follow-up: Return to clinic in 1 month. Of note, the patient does not have a PCP, as his primary care was previously provided by Dr. Mare Ferrari. He will need to be referred to a PCP in the future.  Nelva Bush, MD 01/24/2018 7:49 PM

## 2018-01-24 NOTE — Patient Instructions (Addendum)
Medication Instructions: STOP Clonidine  START Carvedilol 3.125 mg by mouth twice per day   -- If you need a refill on your cardiac medications before your next appointment, please call your pharmacy. --  Labwork: Cbc w/o diff; lipid   Testing/Procedures: None ordered  Follow-Up: Your physician wants you to follow-up in: 1 month with APP     Thank you for choosing [facility]!!    Any Other Special Instructions Will Be Listed Below (If Applicable).

## 2018-01-25 ENCOUNTER — Other Ambulatory Visit: Payer: Self-pay | Admitting: Cardiology

## 2018-01-27 NOTE — Telephone Encounter (Signed)
Pt's pharmacy is requesting a refill on donepezil, which is not a heart medication. Would Dr. Saunders Revel like to refill this medication? Please advise

## 2018-01-28 NOTE — Telephone Encounter (Signed)
Spoke with the patient's son Legrand Como) and informed him that Dr End would refill the Aricept for a 3 month period.  I let him know that Dr End recommends that he needs a PCP so I gave him PCP information. He verbalized understanding.

## 2018-01-28 NOTE — Telephone Encounter (Signed)
The patient does not have a PCP and was previously followed by Dr. Mare Ferrari, who was likely prescribing this medication. Please refill the medication for three months and refer the patient to establish with a new PCP. I am not able to provide PCP services.  Nelva Bush, MD [facility] Pager: [phone]

## 2018-01-29 DIAGNOSIS — Z8719 Personal history of other diseases of the digestive system: Secondary | ICD-10-CM | POA: Diagnosis not present

## 2018-01-29 DIAGNOSIS — D638 Anemia in other chronic diseases classified elsewhere: Secondary | ICD-10-CM | POA: Diagnosis not present

## 2018-01-29 DIAGNOSIS — Z86010 Personal history of colonic polyps: Secondary | ICD-10-CM | POA: Diagnosis not present

## 2018-01-29 DIAGNOSIS — R69 Illness, unspecified: Secondary | ICD-10-CM | POA: Diagnosis not present

## 2018-02-24 NOTE — Progress Notes (Signed)
Cardiology Office Note:    Date:  02/25/2018   ID:  Marc [name], DOB 11/18/1941, MRN [id number]  PCP:  Patient, No Pcp Per  Cardiologist:  Nelva Bush, MD   Referring MD: No ref. provider found   Chief Complaint  Patient presents with  . Follow-up    Shortness of breath, hypertension    History of Present Illness:    Marc [name] is a 77 y.o. male with hypertension, hyperlipidemia, severe LVH, dementia, prior upper GI bleed requiring transfusion with PRBCs.  He was last seen by Dr. Harrell Gave End 01/24/18 and complained of a singular episode of shortness of breath. No further testing was recommended at that time.  His Clonidine was DC'd and he was started on Coreg.     [name] returns for follow up. He is here with his son who helps interpret and provide the history.  The patient has been doing well without chest pain, shortness of breath, syncope.  There has not been any noted bleeding.    Prior CV studies:   The following studies were reviewed today:  Echo 09/2011 Severe LVH, EF 65-70, no RWMA, + dynamic obstruction with mid cavity obliteration, Gr 1 DD, mild AS (mean 13, peak 25), PASP 33, trivial eff,  Past Medical History:  Diagnosis Date  . BPH (benign prostatic hypertrophy)    WITH MICROSCOPIC HEMATURIA  . Hematuria   . Hyperlipidemia   . Hypertension    has severe LVH per echo in November 2012   Surgical Hx: The patient  has a past surgical history that includes Esophagogastroduodenoscopy (egd) with propofol (N/A, 01/08/2018) and Colonoscopy with propofol (N/A, 01/09/2018).   Current Medications: Current Meds  Medication Sig  . atorvastatin (LIPITOR) 20 MG tablet Take 1 tablet (20 mg total) by mouth daily.  . carvedilol (COREG) 3.125 MG tablet Take 1 tablet (3.125 mg total) by mouth 2 (two) times daily.  Marland Kitchen donepezil (ARICEPT) 5 MG tablet TAKE 1 TABLET(5 MG) BY MOUTH AT BEDTIME  . hydrochlorothiazide (HYDRODIURIL) 25 MG tablet Take 1 tablet (25 mg total) by  mouth daily.  . hydrocortisone (ANUSOL-HC) 25 MG suppository Place 25 mg rectally 2 (two) times daily.  . pantoprazole (PROTONIX) 40 MG tablet Take 1 tablet (40 mg total) by mouth daily.  . silodosin (RAPAFLO) 8 MG CAPS capsule TAKE 1 CAPSULE(8 MG) BY MOUTH DAILY     Allergies:   Patient has no known allergies.   Social History   Tobacco Use  . Smoking status: Never Smoker  . Smokeless tobacco: Never Used  Substance Use Topics  . Alcohol use: Yes    Comment: OCCASSIONAL  . Drug use: No     Family Hx: The patient's Family history is unknown by patient.  ROS:   Please see the history of present illness.    ROS All other systems reviewed and are negative.   EKGs/Labs/Other Test Reviewed:    EKG:  EKG is  ordered today.  The ekg ordered today demonstrates normal sinus rhythm, heart rate 60, normal axis, QTC 426  Recent Labs: 01/07/2018: ALT 26 01/10/2018: BUN 11; Creatinine, Ser 1.47; Potassium 3.7; Sodium 138 01/24/2018: Hemoglobin 12.0; Platelets 266   Recent Lipid Panel Lab Results  Component Value Date/Time   CHOL 172 01/24/2018 09:57 AM   TRIG 292 (H) 01/24/2018 09:57 AM   HDL 42 01/24/2018 09:57 AM   CHOLHDL 4.1 01/24/2018 09:57 AM   CHOLHDL 3 04/05/2015 09:09 AM   LDLCALC 72 01/24/2018 09:57 AM  Physical Exam:    VS:  BP 126/76   Pulse 60   Ht 5\' 5"  (1.651 m)   Wt 158 lb 12.8 oz (72 kg)   BMI 26.43 kg/m     Wt Readings from Last 3 Encounters:  02/25/18 158 lb 12.8 oz (72 kg)  01/24/18 156 lb (70.8 kg)  01/09/18 162 lb (73.5 kg)     Physical Exam  Constitutional: He appears well-developed and well-nourished. No distress.  HENT:  Head: Normocephalic and atraumatic.  Neck: Neck supple.  Cardiovascular: Normal rate and regular rhythm.  No murmur heard. Pulmonary/Chest: Effort normal. He has no rales.  Abdominal: Soft.  Musculoskeletal: He exhibits no edema.  Neurological: He is alert.    ASSESSMENT & PLAN:    #1.  Shortness of breath  No  recurrent episodes of shortness of breath since last seen.  No further testing warranted at this time.  #2.  Essential hypertension The patient's blood pressure is controlled on his current regimen.  Continue current therapy.  Arrange referral to primary care.  Dispo:  Return in about 1 year (around 02/26/2019) for Routine Follow Up, w/ Richardson Dopp, PA-C.   Medication Adjustments/Labs and Tests Ordered: Current medicines are reviewed at length with the patient today.  Concerns regarding medicines are outlined above.  Tests Ordered: Orders Placed This Encounter  Procedures  . Ambulatory referral to Internal Medicine  . EKG 12-Lead   Medication Changes: No orders of the defined types were placed in this encounter.   Signed, Richardson Dopp, PA-C  02/25/2018 9:44 AM    [facility] [place], [place], [place]  [address] Phone: [phone]; Fax: [phone]

## 2018-02-25 ENCOUNTER — Ambulatory Visit: Payer: Medicare HMO | Admitting: Physician Assistant

## 2018-02-25 ENCOUNTER — Encounter: Payer: Self-pay | Admitting: Physician Assistant

## 2018-02-25 VITALS — BP 126/76 | HR 60 | Ht 65.0 in | Wt 158.8 lb

## 2018-02-25 DIAGNOSIS — R0602 Shortness of breath: Secondary | ICD-10-CM

## 2018-02-25 DIAGNOSIS — I10 Essential (primary) hypertension: Secondary | ICD-10-CM | POA: Diagnosis not present

## 2018-02-25 NOTE — Patient Instructions (Signed)
Medication Instructions:  1. Your physician recommends that you continue on your current medications as directed. Please refer to the Current Medication list given to you today.   Labwork: NONE ORDERED TODAY  Testing/Procedures: NONE ORDERED TODAY  Follow-Up: 1. A REFERRAL HAS BEEN PLACED FOR PRIMARY CARE WITH  PRIMARY; THEIR OFFICE WILL CALL YOU WITH AN APPT   2. Your physician wants you to follow-up in: [place], [facility]  You will receive a reminder letter in the mail two months in advance. If you don't receive a letter, please call our office to schedule the follow-up appointment.   Any Other Special Instructions Will Be Listed Below (If Applicable).     If you need a refill on your cardiac medications before your next appointment, please call your pharmacy.

## 2018-03-18 ENCOUNTER — Other Ambulatory Visit: Payer: Self-pay | Admitting: Cardiology

## 2018-03-18 NOTE — Telephone Encounter (Signed)
REFILL 

## 2018-03-19 ENCOUNTER — Other Ambulatory Visit: Payer: Self-pay | Admitting: *Deleted

## 2018-03-19 MED ORDER — ATORVASTATIN CALCIUM 20 MG PO TABS: 20.0000 mg | ORAL_TABLET | Freq: Every day | ORAL | 4 refills | Status: DC

## 2018-03-21 DIAGNOSIS — D638 Anemia in other chronic diseases classified elsewhere: Secondary | ICD-10-CM | POA: Diagnosis not present

## 2018-03-21 DIAGNOSIS — Z86010 Personal history of colonic polyps: Secondary | ICD-10-CM | POA: Diagnosis not present

## 2018-03-21 DIAGNOSIS — R69 Illness, unspecified: Secondary | ICD-10-CM | POA: Diagnosis not present

## 2018-03-21 DIAGNOSIS — Z8719 Personal history of other diseases of the digestive system: Secondary | ICD-10-CM | POA: Diagnosis not present

## 2018-03-26 DIAGNOSIS — Z23 Encounter for immunization: Secondary | ICD-10-CM | POA: Diagnosis not present

## 2018-03-26 DIAGNOSIS — R399 Unspecified symptoms and signs involving the genitourinary system: Secondary | ICD-10-CM | POA: Diagnosis not present

## 2018-03-26 DIAGNOSIS — Z0000 Encounter for general adult medical examination without abnormal findings: Secondary | ICD-10-CM | POA: Diagnosis not present

## 2018-03-26 DIAGNOSIS — Z87438 Personal history of other diseases of male genital organs: Secondary | ICD-10-CM | POA: Diagnosis not present

## 2018-03-28 DIAGNOSIS — K2970 Gastritis, unspecified, without bleeding: Secondary | ICD-10-CM | POA: Diagnosis not present

## 2018-03-28 DIAGNOSIS — K209 Esophagitis, unspecified: Secondary | ICD-10-CM | POA: Diagnosis not present

## 2018-04-02 ENCOUNTER — Other Ambulatory Visit: Payer: Self-pay

## 2018-04-02 MED ORDER — HYDROCHLOROTHIAZIDE 25 MG PO TABS: 25.0000 mg | ORAL_TABLET | Freq: Every day | ORAL | 1 refills | Status: DC

## 2018-04-25 ENCOUNTER — Other Ambulatory Visit: Payer: Self-pay | Admitting: Internal Medicine

## 2018-04-25 NOTE — Telephone Encounter (Signed)
Please advise if ok to refill. 

## 2018-04-25 NOTE — Telephone Encounter (Signed)
This is a [facility] patient, but Aricept should be filled by his PCP.  Thanks!

## 2018-05-21 ENCOUNTER — Other Ambulatory Visit: Payer: Self-pay | Admitting: Internal Medicine

## 2018-05-21 ENCOUNTER — Other Ambulatory Visit: Payer: Self-pay

## 2018-05-21 MED ORDER — PANTOPRAZOLE SODIUM 40 MG PO TBEC: 40.0000 mg | DELAYED_RELEASE_TABLET | Freq: Every day | ORAL | 3 refills | Status: DC

## 2018-05-21 NOTE — Telephone Encounter (Signed)
Please review for refill, Thanks !  

## 2018-05-21 NOTE — Telephone Encounter (Signed)
Okay to refill? Please advise. Thanks, MI 

## 2018-05-21 NOTE — Telephone Encounter (Signed)
Spoke to patient's son who said that the medication has already been filled by another physician.

## 2018-06-24 ENCOUNTER — Telehealth: Payer: Self-pay | Admitting: *Deleted

## 2018-06-24 NOTE — Telephone Encounter (Signed)
LMOVM TO CONTACT CLINIC BACK ABOUT  UPDATES  ON CURRENT MEDICATION LIST

## 2018-06-30 ENCOUNTER — Telehealth: Payer: Self-pay | Admitting: Internal Medicine

## 2018-06-30 NOTE — Telephone Encounter (Signed)
Follow Up:   Pt;'s son calling,retuning Shana's call from 06-24-18,concerning his medicine.

## 2018-07-02 NOTE — Telephone Encounter (Signed)
SPOKE WITH PT SON TO UPDATE CURRENT MEDICATION LIST TO REMOVE LOPRESSOR, DUE TO IT  HAS BEEN LOWERING  HEART RATE. PT STATES HE WAS ADVISED BY MICHEL DAPP TO DISCONTINUE METOPROLOL FOR HEART RATE ISSUES.

## 2018-07-02 NOTE — Telephone Encounter (Signed)
Spoke to patient's son who was inquiring about the recent call received pertaining to possible changes in medications.  Please advise, thank you.

## 2018-07-07 ENCOUNTER — Other Ambulatory Visit: Payer: Self-pay | Admitting: Cardiology

## 2018-07-07 ENCOUNTER — Telehealth: Payer: Self-pay | Admitting: Physician Assistant

## 2018-07-07 NOTE — Telephone Encounter (Signed)
DPR ok to s/w pt's son Neing who wanted to confirm pt was not supposed to be taking Clonidine at this time. I confirmed this with the pt's son. Pt's son also confirmed pt is not to take Metoprolol or Coreg at this time as well. I confirmed this with the pt's son who thanked me for the call. He states he just wants to make sure he has the medications correct.

## 2018-07-07 NOTE — Telephone Encounter (Signed)
New Message:    Pt's son wants to know if pt is supposed to be taking Clonidine?

## 2018-07-29 DIAGNOSIS — R69 Illness, unspecified: Secondary | ICD-10-CM | POA: Diagnosis not present

## 2018-08-28 DIAGNOSIS — R69 Illness, unspecified: Secondary | ICD-10-CM | POA: Diagnosis not present

## 2018-08-28 DIAGNOSIS — D649 Anemia, unspecified: Secondary | ICD-10-CM | POA: Diagnosis not present

## 2018-08-28 DIAGNOSIS — Z8719 Personal history of other diseases of the digestive system: Secondary | ICD-10-CM | POA: Diagnosis not present

## 2018-08-28 DIAGNOSIS — Z86010 Personal history of colonic polyps: Secondary | ICD-10-CM | POA: Diagnosis not present

## 2018-09-17 ENCOUNTER — Other Ambulatory Visit: Payer: Self-pay | Admitting: Physician Assistant

## 2019-01-15 ENCOUNTER — Other Ambulatory Visit: Payer: Self-pay | Admitting: Cardiology

## 2019-03-03 ENCOUNTER — Telehealth: Payer: Self-pay | Admitting: *Deleted

## 2019-03-03 NOTE — Telephone Encounter (Signed)
ERROR

## 2019-03-04 ENCOUNTER — Telehealth: Payer: Self-pay | Admitting: Physician Assistant

## 2019-03-04 NOTE — Telephone Encounter (Signed)
°  4/8 :   Called patient. Spoke with son Leone Putman. At first the son was wanting to reschedule until the patient can come into office to be seen. I was able to walk son through download of Webex. The son was able to get on Webex with audio/video. The son will be there to help father with virtual visit. I also sent the son the link and code to activate MyChart.

## 2019-03-09 ENCOUNTER — Telehealth: Payer: Self-pay | Admitting: *Deleted

## 2019-03-09 DIAGNOSIS — I517 Cardiomegaly: Secondary | ICD-10-CM | POA: Insufficient documentation

## 2019-03-09 NOTE — Telephone Encounter (Signed)
LANGUAGE LINE  1 [phone] CALLED TO GET PT  CONSENT. PT WAS UNAVAILABLE AND INTERPRETER LEFT MESSAGE TO CALL BACK CLINIC.  INTERPRETER  WILL BE CONTACTED BACK ONCE THE PT RETURN CALL

## 2019-03-09 NOTE — Progress Notes (Signed)
Virtual Visit via Video Note   This visit type was conducted due to national recommendations for restrictions regarding the COVID-19 Pandemic (e.g. social distancing) in an effort to limit this patient's exposure and mitigate transmission in our community.  Due to his co-morbid illnesses, this patient is at least at moderate risk for complications without adequate follow up.  This format is felt to be most appropriate for this patient at this time.  All issues noted in this document were discussed and addressed.  A limited physical exam was performed with this format.  Please refer to the patient's chart for his consent to telehealth for [facility].   Evaluation Performed:  Follow-up visit  Date:  03/09/2019   ID:  Marc [name], DOB [birth year], MRN [id number]  Patient Location: Home  Provider Location: Home  PCP:  Patient, No Pcp Per  Cardiologist:  Previously Marc Bush, MD >> will est with MD on Marc [name]/Marc Shifflett Kathlen Mody, PA-C  Electrophysiologist:  None   Chief Complaint:  FU on HTN  History of Present Illness:    Marc [name] is a 78 y.o. male who presents via audio/video conferencing for a telehealth visit today.    He has hypertension, hyperlipidemia, CKD, severe LVH, dementia, prior upper GI bleed in 12/2017 requiring transfusion with PRBCs.   He was last seen in 02/25/18.  Today, he is seen with the help of his son Marc [name] - he notes he changed his name).  The patient is non-verbal due to his dementia.  His son and mother help with the hx.  He has not seemed to complain of chest pain, shortness of breath.  He has not had syncope.  He has not had paroxysmal nocturnal dyspnea, leg swelling.  He has not had any bleeding issues.   The patient does not have symptoms concerning for COVID-19 infection (fever, chills, cough, or new shortness of breath).    Past Medical History:  Diagnosis Date  . BPH (benign prostatic hypertrophy)    WITH MICROSCOPIC HEMATURIA  .  Hematuria   . Hyperlipidemia   . Hypertension    has severe LVH per echo in November 2012   Past Surgical History:  Procedure Laterality Date  . COLONOSCOPY WITH PROPOFOL N/A 01/09/2018   Procedure: COLONOSCOPY WITH PROPOFOL;  Surgeon: Marc Brace, MD;  Location: [facility];  Service: Gastroenterology;  Laterality: N/A;  . ESOPHAGOGASTRODUODENOSCOPY (EGD) WITH PROPOFOL N/A 01/08/2018   Procedure: ESOPHAGOGASTRODUODENOSCOPY (EGD) WITH PROPOFOL;  Surgeon: Marc Brace, MD;  Location: [facility];  Service: Gastroenterology;  Laterality: N/A;     No outpatient medications have been marked as taking for the 03/10/19 encounter (Appointment) with Marc [name] T, PA-C.     Allergies:   Patient has no known allergies.   Social History   Tobacco Use  . Smoking status: Never Smoker  . Smokeless tobacco: Never Used  Substance Use Topics  . Alcohol use: Yes    Comment: OCCASSIONAL  . Drug use: No     Family Hx: The patient's Family history is unknown by patient.  ROS:   Please see the history of present illness.      All other systems reviewed and are negative.   Prior CV studies:   The following studies were reviewed today:    Echo 09/2011 Severe LVH, EF 65-70, no RWMA, + dynamic obstruction with mid cavity obliteration, Gr 1 DD, mild AS (mean 13, peak 25), PASP 33, trivial eff,   Labs/Other Tests and Data Reviewed:  EKG:  No ECG reviewed.  Recent Labs: No results found for requested labs within last 8760 hours.   Recent Lipid Panel Lab Results  Component Value Date/Time   CHOL 172 01/24/2018 09:57 AM   TRIG 292 (H) 01/24/2018 09:57 AM   HDL 42 01/24/2018 09:57 AM   CHOLHDL 4.1 01/24/2018 09:57 AM   CHOLHDL 3 04/05/2015 09:09 AM   LDLCALC 72 01/24/2018 09:57 AM    From [employer]    Wt Readings from Last 3 Encounters:  02/25/18 158 lb 12.8 oz (72 kg)  01/24/18 156 lb (70.8 kg)  01/09/18 162 lb (73.5 kg)     Objective:    Vital Signs:  There  were no vitals taken for this visit.   GEN:  During our conversation, the patient does not seem to be in acute distress.    RESP:  Respiratory effort seems to be normal.    PSYCH:  Flat affect  ASSESSMENT & PLAN:    Essential hypertension The patient's blood pressure is controlled on his current regimen.  Continue current therapy.    Severe left ventricular hypertrophy BP well controlled.  Continue current Rx.  No symptoms to suggest significant obstruction.  Given his dementia, we will try to adhere to conservative management.  Therefore, we will not arrange a routine echocardiogram at this time.  He was previously followed by Dr. Saunders [name].  Given his transition to [facility], I will follow him going forward along with Dr. Burt [name].    COVID-19 Education: The signs and symptoms of COVID-19 were discussed with the patient and how to seek Marc for testing (follow up with PCP or arrange E-visit).   The importance of social distancing was discussed today.  Time:   Today, I have spent 12 minutes with the patient with telehealth technology discussing the above problems.     Medication Adjustments/Labs and Tests Ordered: Current medicines are reviewed at length with the patient today.  Concerns regarding medicines are outlined above.  Tests Ordered: No orders of the defined types were placed in this encounter.   Medication Changes: No orders of the defined types were placed in this encounter.   Disposition:  Follow up in 1 year(s)  Signed, Marc Dopp, PA-C  03/09/2019 4:44 PM    [facility]

## 2019-03-10 ENCOUNTER — Other Ambulatory Visit: Payer: Self-pay

## 2019-03-10 ENCOUNTER — Telehealth (INDEPENDENT_AMBULATORY_CARE_PROVIDER_SITE_OTHER): Payer: Medicare HMO | Admitting: Physician Assistant

## 2019-03-10 VITALS — BP 138/82 | HR 82 | Ht 64.0 in | Wt 170.0 lb

## 2019-03-10 DIAGNOSIS — I119 Hypertensive heart disease without heart failure: Secondary | ICD-10-CM | POA: Diagnosis not present

## 2019-03-10 DIAGNOSIS — R69 Illness, unspecified: Secondary | ICD-10-CM | POA: Diagnosis not present

## 2019-03-10 DIAGNOSIS — I517 Cardiomegaly: Secondary | ICD-10-CM

## 2019-03-10 DIAGNOSIS — F0390 Unspecified dementia without behavioral disturbance: Secondary | ICD-10-CM | POA: Diagnosis not present

## 2019-03-10 DIAGNOSIS — Z7189 Other specified counseling: Secondary | ICD-10-CM

## 2019-03-10 DIAGNOSIS — I10 Essential (primary) hypertension: Secondary | ICD-10-CM

## 2019-03-10 MED ORDER — CARVEDILOL 3.125 MG PO TABS: 3.1250 mg | ORAL_TABLET | Freq: Two times a day (BID) | ORAL | 3 refills | Status: DC

## 2019-03-10 NOTE — Patient Instructions (Signed)
Medication Instructions:  No changes.   If you need a refill on your cardiac medications before your next appointment, please call your pharmacy.   Lab work: None   If you have labs (blood work) drawn today and your tests are completely normal, you will receive your results only by: Marland Kitchen MyChart Message (if you have MyChart) OR . A paper copy in the mail If you have any lab test that is abnormal or we need to change your treatment, we will call you to review the results.  Testing/Procedures: None   Follow-Up: At [facility], you and your health needs are our priority.  As part of our continuing mission to provide you with exceptional heart care, we have created designated Provider Care Teams.  These Care Teams include your primary Cardiologist (physician) and Advanced Practice Providers (APPs -  Physician Assistants and Nurse Practitioners) who all work together to provide you with the care you need, when you need it. Richardson Dopp, PA-C in 1 year.  Any Other Special Instructions Will Be Listed Below (If Applicable).

## 2019-03-12 ENCOUNTER — Other Ambulatory Visit: Payer: Self-pay | Admitting: Physician Assistant

## 2019-04-15 ENCOUNTER — Other Ambulatory Visit: Payer: Self-pay | Admitting: Cardiology

## 2019-06-16 ENCOUNTER — Other Ambulatory Visit: Payer: Self-pay | Admitting: Cardiology

## 2019-06-16 NOTE — Telephone Encounter (Signed)
Refill Request.  

## 2019-07-14 DIAGNOSIS — R413 Other amnesia: Secondary | ICD-10-CM | POA: Diagnosis not present

## 2019-07-14 DIAGNOSIS — Z0000 Encounter for general adult medical examination without abnormal findings: Secondary | ICD-10-CM | POA: Diagnosis not present

## 2019-07-14 DIAGNOSIS — R399 Unspecified symptoms and signs involving the genitourinary system: Secondary | ICD-10-CM | POA: Diagnosis not present

## 2019-09-10 DIAGNOSIS — R69 Illness, unspecified: Secondary | ICD-10-CM | POA: Diagnosis not present

## 2020-02-24 ENCOUNTER — Other Ambulatory Visit: Payer: Self-pay | Admitting: Cardiology

## 2020-02-24 MED ORDER — HYDROCHLOROTHIAZIDE 25 MG PO TABS: ORAL_TABLET | ORAL | 0 refills | Status: DC

## 2020-02-24 MED ORDER — ATORVASTATIN CALCIUM 20 MG PO TABS: 20.0000 mg | ORAL_TABLET | Freq: Every day | ORAL | 0 refills | Status: DC

## 2020-03-10 ENCOUNTER — Other Ambulatory Visit: Payer: Self-pay | Admitting: Physician Assistant

## 2020-03-16 DIAGNOSIS — K648 Other hemorrhoids: Secondary | ICD-10-CM | POA: Diagnosis not present

## 2020-03-16 DIAGNOSIS — K625 Hemorrhage of anus and rectum: Secondary | ICD-10-CM | POA: Diagnosis not present

## 2020-03-16 DIAGNOSIS — R413 Other amnesia: Secondary | ICD-10-CM | POA: Diagnosis not present

## 2020-03-20 NOTE — Progress Notes (Signed)
Cardiology Office Note:    Date:  03/22/2020   ID:  Marc [name], DOB [birth year], MRN [id number]  PCP:  Bartholome Bill, MD  Cardiologist:  Sherren Mocha, MD / Richardson Dopp, PA-C  Electrophysiologist:  None   Referring MD: Bartholome Bill, MD   Chief Complaint:  Follow-up (HTN)    Patient Profile:    Marc [name] is a 79 y.o. male with:   Hypertensive Heart Disease  Echocardiogram 11/12: severe LVH, EF 65-70, dynamic obst w mid cavity obliteration  Mild aortic stenosis (mean 13 mmHg) by echocardiogram 2012  Hyperlipidemia   Chronic kidney disease   Severe LVH   Dementia   Prior upper GI bleed in 12/2017; required txn with PRBCs  Prior CV studies: Echo 09/2011 Severe LVH, EF 65-70, no RWMA, + dynamic obstruction with mid cavity obliteration, Gr 1 DD, mild AS (mean 13, peak 25), PASP 33, trivial eff,  History of Present Illness:    Marc [name] returns for follow-up.  He is seen with the assistance of an interpreter.  His son is also here with him.  He has not seemed to complain of chest discomfort or shortness of breath.  He has not had syncope or significant leg swelling.  He did have some blood in his stool after his second COVID-19 vaccine.  This has resolved.     Past Medical History:  Diagnosis Date  . BPH (benign prostatic hypertrophy)    WITH MICROSCOPIC HEMATURIA  . Hematuria   . Hyperlipidemia   . Hypertension    has severe LVH per echo in November 2012    Current Medications: Current Meds  Medication Sig  . atorvastatin (LIPITOR) 20 MG tablet Take 1 tablet (20 mg total) by mouth daily. Please keep upcoming appt in April for future refills. Thank you  . carvedilol (COREG) 3.125 MG tablet Take 1 tablet (3.125 mg total) by mouth 2 (two) times daily with a meal. Please keep upcoming appt in April for future refills. Thank you  . donepezil (ARICEPT) 5 MG tablet TAKE 1 TABLET(5 MG) BY MOUTH AT BEDTIME  . hydrochlorothiazide (HYDRODIURIL) 25 MG  tablet TAKE 1 TABLET(25 MG) BY MOUTH DAILY. Please keep upcoming appt in April for future refills. Thank you  . hydrocortisone (ANUSOL-HC) 25 MG suppository Place 25 mg rectally 2 (two) times daily.  . silodosin (RAPAFLO) 8 MG CAPS capsule TAKE 1 CAPSULE(8 MG) BY MOUTH DAILY     Allergies:   Patient has no known allergies.   Social History   Tobacco Use  . Smoking status: Never Smoker  . Smokeless tobacco: Never Used  Substance Use Topics  . Alcohol use: Yes    Comment: OCCASSIONAL  . Drug use: No     Family Hx: The patient's Family history is unknown by patient.  ROS   EKGs/Labs/Other Test Reviewed:    EKG:  EKG is   ordered today.  The ekg ordered today demonstrates normal sinus rhythm, HR 78, normal axis, QTc 435, non-specific ST-TW changes, no changes   Recent Labs: No results found for requested labs within last 8760 hours.   Recent Lipid Panel Lab Results  Component Value Date/Time   CHOL 172 01/24/2018 09:57 AM   TRIG 292 (H) 01/24/2018 09:57 AM   HDL 42 01/24/2018 09:57 AM   CHOLHDL 4.1 01/24/2018 09:57 AM   CHOLHDL 3 04/05/2015 09:09 AM   LDLCALC 72 01/24/2018 09:57 AM    Physical Exam:    VS:  BP 138/70  Pulse 78   Ht 5\' 4"  (1.626 m)   Wt 162 lb 12.8 oz (73.8 kg)   SpO2 94%   BMI 27.94 kg/m     Wt Readings from Last 3 Encounters:  03/22/20 162 lb 12.8 oz (73.8 kg)  03/10/19 170 lb (77.1 kg)  02/25/18 158 lb 12.8 oz (72 kg)     Constitutional:      Appearance: Not in distress.  Neck:     Thyroid: No thyromegaly.  Pulmonary:     Breath sounds: No wheezing. No rales.  Cardiovascular:     Normal rate. Regular rhythm. Normal S1. Normal S2.     Murmurs: There is a grade 1/6 early systolic murmur at the URSB.  Edema:    Peripheral edema absent.  Abdominal:     Palpations: Abdomen is soft.  Skin:    General: Skin is warm and dry.  Neurological:     General: No focal deficit present.     Mental Status: Alert.       ASSESSMENT & PLAN:     1. Hypertensive heart disease without CHF Hx of severe LVH.  Echocardiogram in 2012 with mild aortic stenosis.  He does not have a significant murmur of aortic stenosis on exam.  He is managed conservatively due to advanced dementia.  He does not need a follow up echocardiogram at this time.  BP seems to be well controlled.  Continue current management.  He can follow up in 18 months.   Dispo:  Return in about 18 months (around 09/21/2021) for Routine Follow Up, w/ Richardson Dopp, PA-C, in person.   Medication Adjustments/Labs and Tests Ordered: Current medicines are reviewed at length with the patient today.  Concerns regarding medicines are outlined above.  Tests Ordered: Orders Placed This Encounter  Procedures  . EKG 12-Lead   Medication Changes: No orders of the defined types were placed in this encounter.   Signed, Richardson Dopp, PA-C  03/22/2020 9:21 AM    [facility] [place], [place], [place]  [address] Phone: [phone]; Fax: [phone]

## 2020-03-22 ENCOUNTER — Other Ambulatory Visit: Payer: Self-pay

## 2020-03-22 ENCOUNTER — Ambulatory Visit: Payer: Medicare HMO | Admitting: Physician Assistant

## 2020-03-22 ENCOUNTER — Encounter: Payer: Self-pay | Admitting: Physician Assistant

## 2020-03-22 VITALS — BP 138/70 | HR 78 | Ht 64.0 in | Wt 162.8 lb

## 2020-03-22 DIAGNOSIS — I119 Hypertensive heart disease without heart failure: Secondary | ICD-10-CM | POA: Diagnosis not present

## 2020-03-22 NOTE — Patient Instructions (Signed)
Medication Instructions:  Your physician recommends that you continue on your current medications as directed. Please refer to the Current Medication list given to you today.  *If you need a refill on your cardiac medications before your next appointment, please call your pharmacy*   Lab Work: NONE   Testing/Procedures: NONE   Follow-Up: At [facility], you and your health needs are our priority.  As part of our continuing mission to provide you with exceptional heart care, we have created designated Provider Care Teams.  These Care Teams include your primary Cardiologist (physician) and Advanced Practice Providers (APPs -  Physician Assistants and Nurse Practitioners) who all work together to provide you with the care you need, when you need it.  We recommend signing up for the patient portal called "MyChart".  Sign up information is provided on this After Visit Summary.  MyChart is used to connect with patients for Virtual Visits (Telemedicine).  Patients are able to view lab/test results, encounter notes, upcoming appointments, etc.  Non-urgent messages can be sent to your provider as well.   To learn more about what you can do with MyChart, go to [email or web address].    Your next appointment:   18 month(s)  The format for your next appointment:   In Person  Provider:   Richardson Dopp, PA-C

## 2020-06-05 ENCOUNTER — Other Ambulatory Visit: Payer: Self-pay | Admitting: Physician Assistant

## 2020-06-08 ENCOUNTER — Other Ambulatory Visit: Payer: Self-pay | Admitting: Physician Assistant

## 2020-07-14 DIAGNOSIS — G319 Degenerative disease of nervous system, unspecified: Secondary | ICD-10-CM | POA: Diagnosis not present

## 2020-07-14 DIAGNOSIS — R69 Illness, unspecified: Secondary | ICD-10-CM | POA: Diagnosis not present

## 2020-08-28 DIAGNOSIS — Z79899 Other long term (current) drug therapy: Secondary | ICD-10-CM | POA: Insufficient documentation

## 2020-08-28 DIAGNOSIS — R69 Illness, unspecified: Secondary | ICD-10-CM | POA: Diagnosis not present

## 2020-08-28 DIAGNOSIS — F0390 Unspecified dementia without behavioral disturbance: Secondary | ICD-10-CM | POA: Diagnosis not present

## 2020-08-28 DIAGNOSIS — R109 Unspecified abdominal pain: Secondary | ICD-10-CM | POA: Insufficient documentation

## 2020-08-28 DIAGNOSIS — K625 Hemorrhage of anus and rectum: Secondary | ICD-10-CM | POA: Insufficient documentation

## 2020-08-28 DIAGNOSIS — K5900 Constipation, unspecified: Secondary | ICD-10-CM | POA: Diagnosis not present

## 2020-08-28 DIAGNOSIS — R531 Weakness: Secondary | ICD-10-CM | POA: Insufficient documentation

## 2020-08-28 DIAGNOSIS — K649 Unspecified hemorrhoids: Secondary | ICD-10-CM | POA: Diagnosis not present

## 2020-08-28 DIAGNOSIS — I10 Essential (primary) hypertension: Secondary | ICD-10-CM | POA: Diagnosis not present

## 2020-08-29 ENCOUNTER — Emergency Department (HOSPITAL_COMMUNITY)
Admission: EM | Admit: 2020-08-29 | Discharge: 2020-08-29 | Disposition: A | Discharge status: Home / Self Care | Payer: Medicare HMO | Attending: Emergency Medicine | Admitting: Emergency Medicine

## 2020-08-29 ENCOUNTER — Encounter (HOSPITAL_COMMUNITY): Payer: Self-pay | Admitting: Emergency Medicine

## 2020-08-29 ENCOUNTER — Emergency Department (HOSPITAL_COMMUNITY): Payer: Medicare HMO

## 2020-08-29 ENCOUNTER — Other Ambulatory Visit: Payer: Self-pay

## 2020-08-29 DIAGNOSIS — K649 Unspecified hemorrhoids: Secondary | ICD-10-CM

## 2020-08-29 DIAGNOSIS — R109 Unspecified abdominal pain: Secondary | ICD-10-CM | POA: Diagnosis not present

## 2020-08-29 DIAGNOSIS — K5900 Constipation, unspecified: Secondary | ICD-10-CM

## 2020-08-29 LAB — CBC
HCT: 46.8 % (ref 39.0–52.0)
Hemoglobin: 16 g/dL (ref 13.0–17.0)
MCH: 32.2 pg (ref 26.0–34.0)
MCHC: 34.2 g/dL (ref 30.0–36.0)
MCV: 94.2 fL (ref 80.0–100.0)
Platelets: 167 10*3/uL (ref 150–400)
RBC: 4.97 MIL/uL (ref 4.22–5.81)
RDW: 13.9 % (ref 11.5–15.5)
WBC: 10.7 10*3/uL — ABNORMAL HIGH (ref 4.0–10.5)
nRBC: 0 % (ref 0.0–0.2)

## 2020-08-29 LAB — TYPE AND SCREEN
ABO/RH(D): B POS
Antibody Screen: NEGATIVE

## 2020-08-29 LAB — COMPREHENSIVE METABOLIC PANEL
ALT: 37 U/L (ref 0–44)
AST: 29 U/L (ref 15–41)
Albumin: 3.8 g/dL (ref 3.5–5.0)
Alkaline Phosphatase: 69 U/L (ref 38–126)
Anion gap: 9 (ref 5–15)
BUN: 20 mg/dL (ref 8–23)
CO2: 24 mmol/L (ref 22–32)
Calcium: 8.9 mg/dL (ref 8.9–10.3)
Chloride: 104 mmol/L (ref 98–111)
Creatinine, Ser: 1.41 mg/dL — ABNORMAL HIGH (ref 0.61–1.24)
GFR calc Af Amer: 55 mL/min — ABNORMAL LOW (ref >60)
GFR calc non Af Amer: 47 mL/min — ABNORMAL LOW (ref >60)
Glucose, Bld: 164 mg/dL — ABNORMAL HIGH (ref 70–99)
Potassium: 3.6 mmol/L (ref 3.5–5.1)
Sodium: 137 mmol/L (ref 135–145)
Total Bilirubin: 0.9 mg/dL (ref 0.3–1.2)
Total Protein: 7.1 g/dL (ref 6.5–8.1)

## 2020-08-29 MED ORDER — SODIUM CHLORIDE (PF) 0.9 % IJ SOLN
INTRAMUSCULAR | Status: AC
  Filled 2020-08-29: qty 50

## 2020-08-29 MED ORDER — HYDROCORTISONE (PERIANAL) 2.5 % EX CREA: 1.0000 "application " | TOPICAL_CREAM | Freq: Two times a day (BID) | CUTANEOUS | 0 refills | Status: DC

## 2020-08-29 MED ORDER — IOHEXOL 300 MG/ML  SOLN
100.0000 mL | Freq: Once | INTRAMUSCULAR | Status: AC | PRN
  Administered 2020-08-29: 100 mL via INTRAVENOUS

## 2020-08-29 NOTE — Discharge Instructions (Addendum)
Apply Anusol cream twice daily rotated with Preparation H twice daily.  Take magnesium citrate: Drink 8 ounces mixed with equal parts Sprite or Gatorade for relief of constipation. This medication is available over-the-counter.  Begin taking Colace 100 mg twice daily for prevention of constipation. This medication is available over-the-counter.  Follow-up with [place] surgery if bleeding persists, and return to the ER if bleeding worsens, you develop high fever, severe abdominal pain, or other new and concerning symptoms.

## 2020-08-29 NOTE — ED Provider Notes (Signed)
[facility] DEPT Provider Note   CSN: [id number] Arrival date & time: 08/28/20  2338     History Chief Complaint  Patient presents with  . Rectal Bleeding    Marc [name] is a 79 y.o. male.  Patient is a 79 year old male with history of dementia, hypertension, hyperlipidemia.  He is brought today for evaluation of abdominal pain, rectal bleeding, and weakness.  This has apparently been worsening over the past several days.  He has been refusing to take his medications and has been more agitated recently.  They deny any fevers or chills.  He has been describing pain in his suprapubic region.  The son who is present at bedside provides the majority of the history as the patient has dementia and only speaks [place].  The history is provided by the patient.  Rectal Bleeding Quality:  Bright red Amount:  Moderate Duration:  2 days Timing:  Constant Chronicity:  New Relieved by:  Nothing Worsened by:  Nothing Ineffective treatments:  None tried Associated symptoms: abdominal pain   Associated symptoms: no fever        Past Medical History:  Diagnosis Date  . BPH (benign prostatic hypertrophy)    WITH MICROSCOPIC HEMATURIA  . Hematuria   . Hyperlipidemia   . Hypertension    has severe LVH per echo in November 2012    Patient Active Problem List   Diagnosis Date Noted  . Severe left ventricular hypertrophy 03/09/2019  . Dementia ([place]) 01/07/2018  . BPH (benign prostatic hypertrophy) 01/07/2018  . Acute blood loss anemia 01/07/2018  . Symptomatic anemia 01/07/2018  . GI bleed 01/07/2018  . Age-related memory disorder 02/24/2013  . HTN (hypertension) 10/02/2011  . Hyperlipidemia 10/02/2011  . Hematuria 10/02/2011    Past Surgical History:  Procedure Laterality Date  . COLONOSCOPY WITH PROPOFOL N/A 01/09/2018   Procedure: COLONOSCOPY WITH PROPOFOL;  Surgeon: Otis Brace, MD;  Location: [facility];  Service: Gastroenterology;   Laterality: N/A;  . ESOPHAGOGASTRODUODENOSCOPY (EGD) WITH PROPOFOL N/A 01/08/2018   Procedure: ESOPHAGOGASTRODUODENOSCOPY (EGD) WITH PROPOFOL;  Surgeon: Otis Brace, MD;  Location: [facility];  Service: Gastroenterology;  Laterality: N/A;       Family History  Family history unknown: Yes    Social History   Tobacco Use  . Smoking status: Never Smoker  . Smokeless tobacco: Never Used  Substance Use Topics  . Alcohol use: Yes    Comment: OCCASSIONAL  . Drug use: No    Home Medications Prior to Admission medications   Medication Sig Start Date End Date Taking? Authorizing Provider  atorvastatin (LIPITOR) 20 MG tablet TAKE 1 TABLET BY MOUTH DAILY 06/07/20   Richardson Dopp T, PA-C  carvedilol (COREG) 3.125 MG tablet TAKE 1 TABLET BY MOUTH TWICE DAILY WITH MEALS 06/09/20   Weaver, Scott T, PA-C  donepezil (ARICEPT) 5 MG tablet TAKE 1 TABLET(5 MG) BY MOUTH AT BEDTIME 01/28/18   End, Harrell Gave, MD  hydrochlorothiazide (HYDRODIURIL) 25 MG tablet TAKE 1 TABLET BY MOUTH DAILY 06/09/20   Richardson Dopp T, PA-C  hydrocortisone (ANUSOL-HC) 25 MG suppository Place 25 mg rectally 2 (two) times daily.    [provider]  silodosin (RAPAFLO) 8 MG CAPS capsule TAKE 1 CAPSULE(8 MG) BY MOUTH DAILY 06/16/19   Consuelo Pandy, PA-C    Allergies    Patient has no known allergies.  Review of Systems   Review of Systems  Constitutional: Negative for fever.  Gastrointestinal: Positive for abdominal pain and hematochezia.  All other  systems reviewed and are negative.   Physical Exam Updated Vital Signs BP 138/89 (BP Location: Left Arm)   Pulse (!) 108   Temp 98.4 F (36.9 C) (Oral)   Resp 18   Ht 5\' 4"  (1.626 m)   Wt 73.5 kg   SpO2 95%   BMI 27.81 kg/m   Physical Exam Vitals and nursing note reviewed.  Constitutional:      General: He is not in acute distress.    Appearance: He is well-developed. He is not diaphoretic.  HENT:     Head: Normocephalic and atraumatic.   Cardiovascular:     Rate and Rhythm: Normal rate and regular rhythm.     Heart sounds: No murmur heard.  No friction rub.  Pulmonary:     Effort: Pulmonary effort is normal. No respiratory distress.     Breath sounds: Normal breath sounds. No wheezing or rales.  Abdominal:     General: Bowel sounds are normal. There is no distension.     Palpations: Abdomen is soft.     Tenderness: There is no abdominal tenderness.  Genitourinary:    Comments: There is a moderate sized hemorrhoid noted.  There is bright red blood in the rectal area.  DRE limited secondary to pain. Musculoskeletal:        General: Normal range of motion.     Cervical back: Normal range of motion and neck supple.  Skin:    General: Skin is warm and dry.  Neurological:     Mental Status: He is alert and oriented to person, place, and time.     Coordination: Coordination normal.     ED Results / Procedures / Treatments   Labs (all labs ordered are listed, but only abnormal results are displayed) Labs Reviewed  COMPREHENSIVE METABOLIC PANEL  CBC  POC OCCULT BLOOD, ED  TYPE AND SCREEN    EKG None  Radiology No results found.  Procedures Procedures (including critical care time)  Medications Ordered in ED Medications - No data to display  ED Course  I have reviewed the triage vital signs and the nursing notes.  Pertinent labs & imaging results that were available during my care of the patient were reviewed by me and considered in my medical decision making (see chart for details).   MDM Rules/Calculators/A&P  Patient is a 79 year old male with history of dementia brought by son for evaluation of abdominal pain and rectal bleeding.  On exam, his vitals are stable. He does have a moderate sized hemorrhoid that I suspect is the cause of the bleeding. CT scan of the abdomen and pelvis shows constipation, but no intra-abdominal process. Patient's vitals are stable with no hypotension or tachycardia. He is  afebrile.  At this point, I see no indication for admission. Recommendations will be for alternating hydrocortisone cream and Preparation H along with magnesium citrate and stool softeners. To follow-up with general surgery if bleeding persists and return if it worsens.  Final Clinical Impression(s) / ED Diagnoses Final diagnoses:  None    Rx / DC Orders ED Discharge Orders    None       Veryl Speak, MD 08/29/20 [phone]

## 2020-08-29 NOTE — ED Triage Notes (Signed)
PT family reports that pt has been having rectal bleed along with abdominal pain and weakness.

## 2020-09-27 DIAGNOSIS — Z79899 Other long term (current) drug therapy: Secondary | ICD-10-CM | POA: Diagnosis not present

## 2020-09-27 DIAGNOSIS — Z9181 History of falling: Secondary | ICD-10-CM | POA: Diagnosis not present

## 2020-09-27 DIAGNOSIS — Z0001 Encounter for general adult medical examination with abnormal findings: Secondary | ICD-10-CM | POA: Diagnosis not present

## 2020-09-27 DIAGNOSIS — I119 Hypertensive heart disease without heart failure: Secondary | ICD-10-CM | POA: Diagnosis not present

## 2020-09-27 DIAGNOSIS — Z130 Encounter for screening for diseases of the blood and blood-forming organs and certain disorders involving the immune mechanism: Secondary | ICD-10-CM | POA: Diagnosis not present

## 2020-09-27 DIAGNOSIS — Z13228 Encounter for screening for other metabolic disorders: Secondary | ICD-10-CM | POA: Diagnosis not present

## 2020-09-27 DIAGNOSIS — I10 Essential (primary) hypertension: Secondary | ICD-10-CM | POA: Diagnosis not present

## 2020-09-27 DIAGNOSIS — Z23 Encounter for immunization: Secondary | ICD-10-CM | POA: Diagnosis not present

## 2020-09-27 DIAGNOSIS — R69 Illness, unspecified: Secondary | ICD-10-CM | POA: Diagnosis not present

## 2020-09-27 DIAGNOSIS — Z0000 Encounter for general adult medical examination without abnormal findings: Secondary | ICD-10-CM | POA: Diagnosis not present

## 2021-07-16 IMAGING — CT CT ABD-PELV W/ CM
2 of 5 series · 15 of 46 positions shown, 17 images · IV contrast (omnipaque)
Comparison: None.

CLINICAL DATA: Rectal bleeding and abdominal pain.

EXAM:
CT ABDOMEN AND PELVIS WITH CONTRAST
TECHNIQUE: Multidetector CT imaging of the abdomen and pelvis was performed
using the standard protocol following bolus administration of
intravenous contrast.
CONTRAST:  100mL OMNIPAQUE IOHEXOL 300 MG/ML  SOLN

[Series 2: axial st · axial · 0.75mm/px · z∈[-420,+14]mm · 12 of 101 slices shown, 14 images]
[im 7/101  soft-tissue]
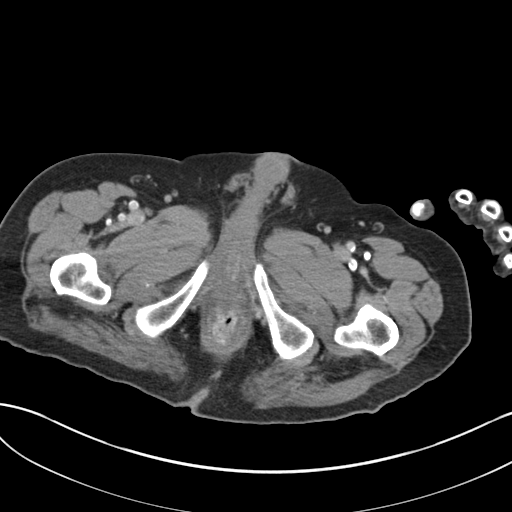
[im 7/101  bone]
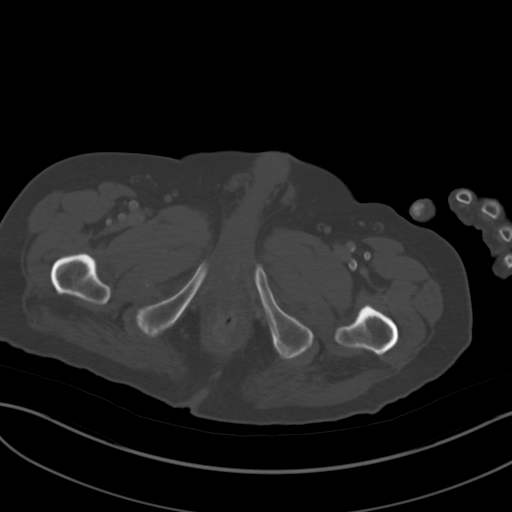
[im 14/101  soft-tissue]
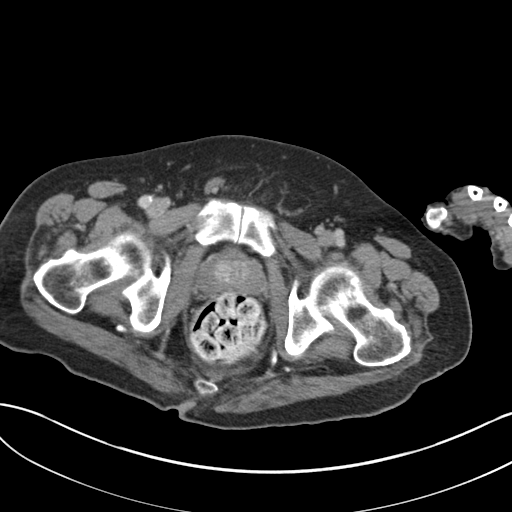
[im 21/101  soft-tissue]
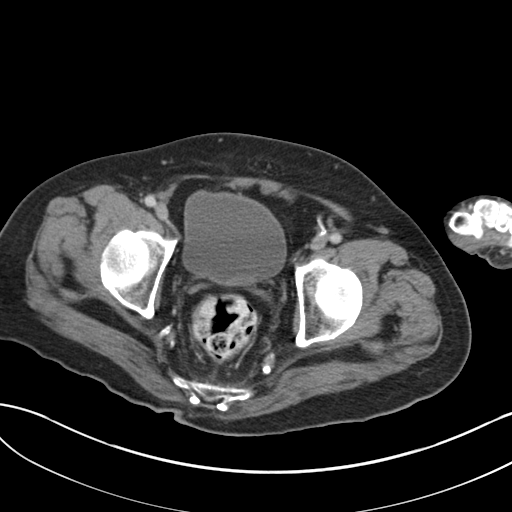
[im 34/101  soft-tissue]
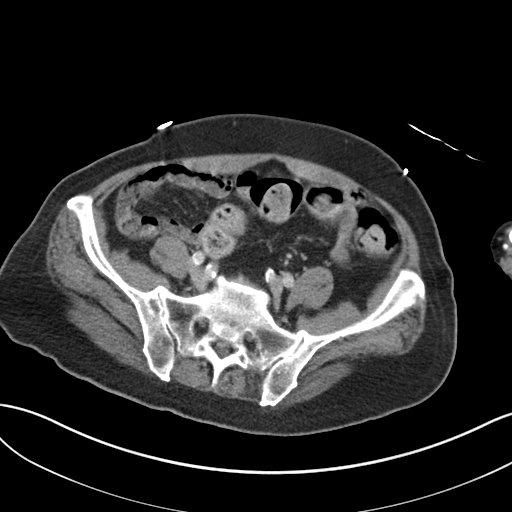
[im 41/101  soft-tissue]
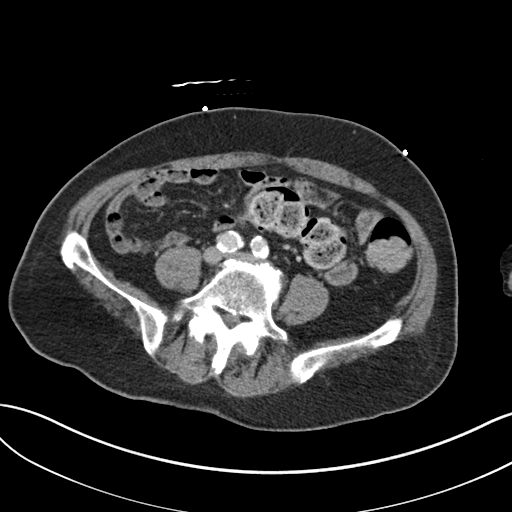
[im 47/101  soft-tissue]
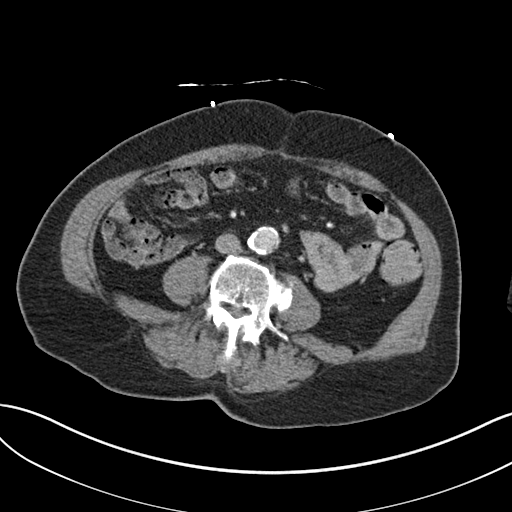
[im 54/101  soft-tissue]
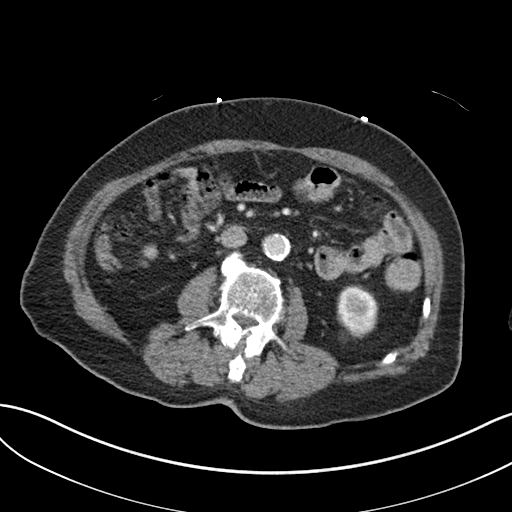
[im 61/101  soft-tissue]
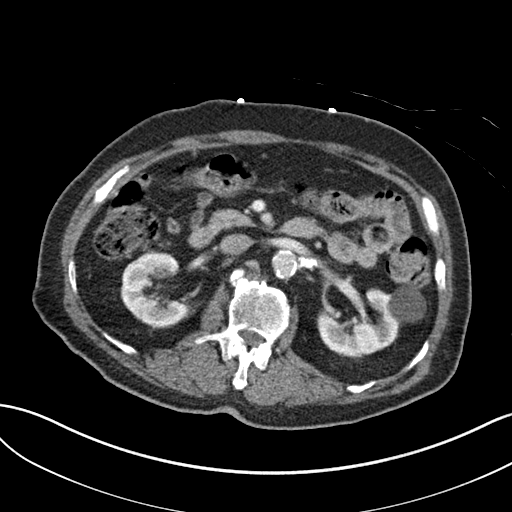
[im 67/101  soft-tissue]
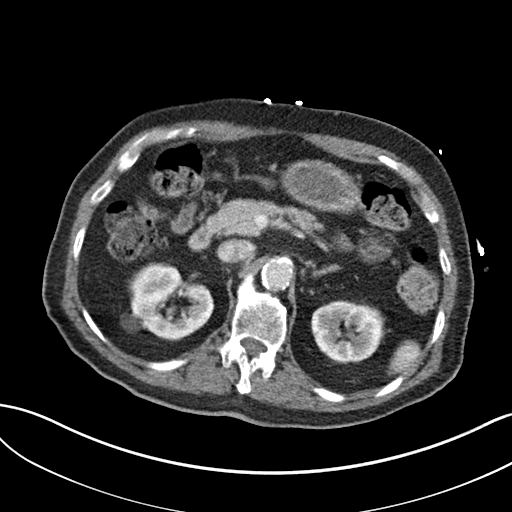
[im 67/101  bone]
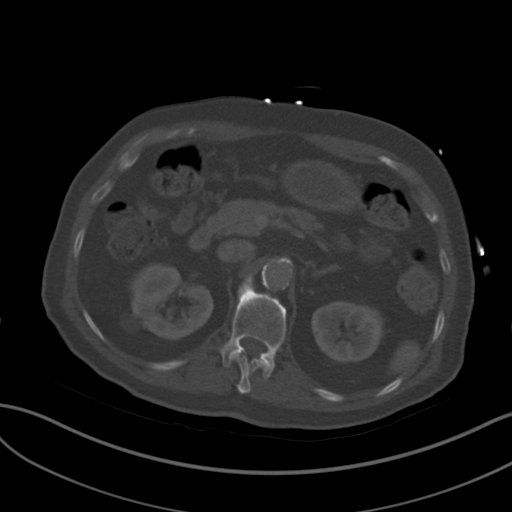
[im 81/101  soft-tissue]
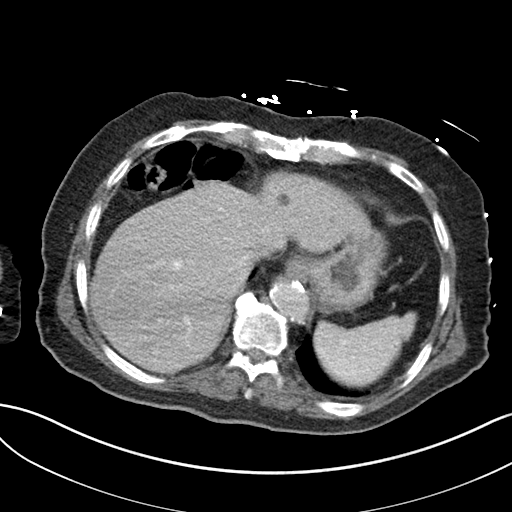
[im 87/101  soft-tissue]
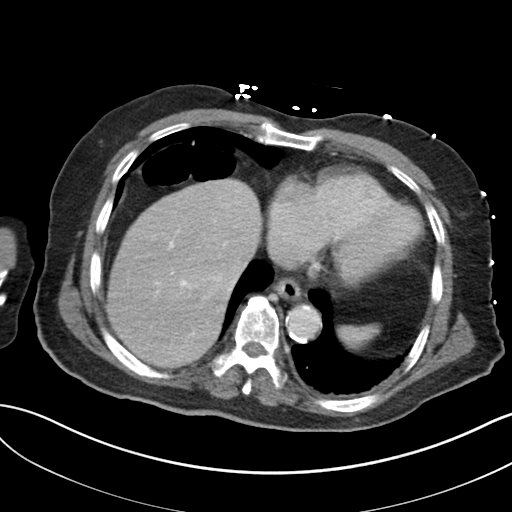
[im 94/101  soft-tissue]
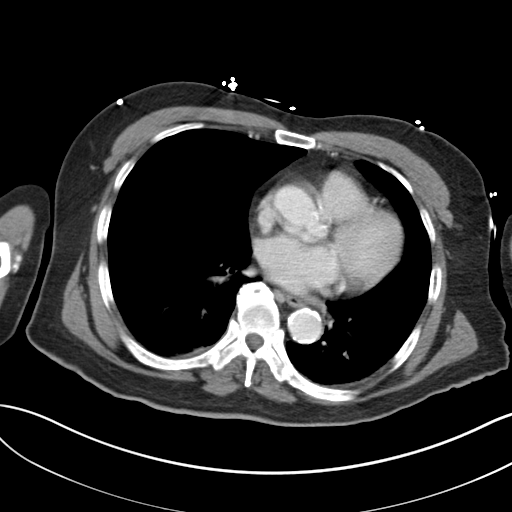

[Series 5: coronal st · coronal · 0.74mm/px · 3 of 141 slices shown]
[im 47/141  soft-tissue]
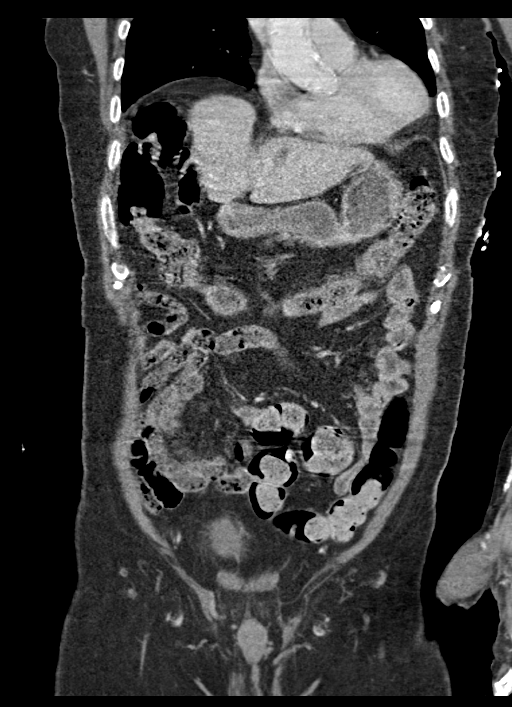
[im 63/141  soft-tissue]
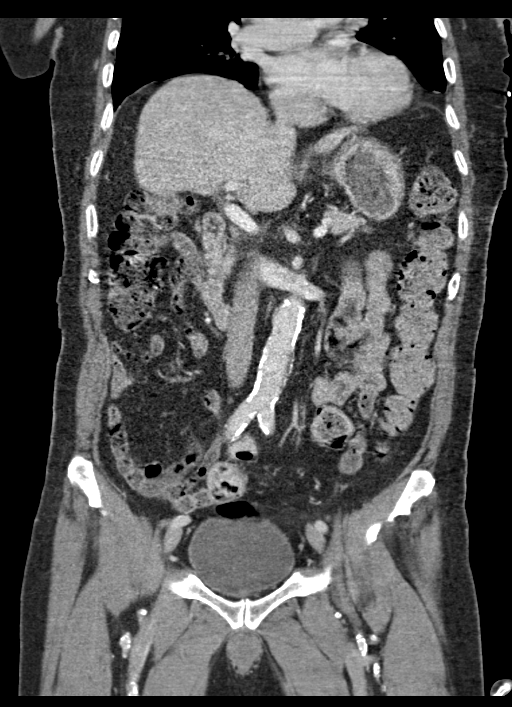
[im 78/141  soft-tissue]
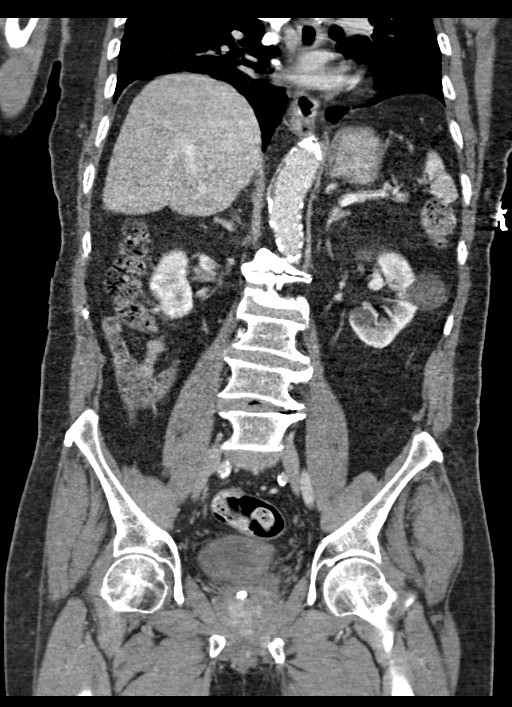

[15 of 46 positions shown; findings below may reference images not displayed]

FINDINGS: Lower chest: The lung bases are clear. The heart size is normal.

Hepatobiliary: There is a probable cyst in the left hepatic lobe
measuring approximately 1 cm. Normal gallbladder.There is no biliary
ductal dilation.

Pancreas: Normal contours without ductal dilatation. No
peripancreatic fluid collection.

Spleen: Unremarkable.

Adrenals/Urinary Tract:

--Adrenal glands: Unremarkable.

--Right kidney/ureter: No hydronephrosis or radiopaque kidney
stones.

--Left kidney/ureter: No hydronephrosis or radiopaque kidney stones.

--Urinary bladder: There is a potential filling defect involving the
posterior urinary bladder (axial series 2, image 80).

Stomach/Bowel:

--Stomach/Duodenum: No hiatal hernia or other gastric abnormality.
Normal duodenal course and caliber.

--Small bowel: Unremarkable.

--Colon: There is a large amount of stool throughout the colon,
especially at the level of the rectum.

--Appendix: Normal.

Vascular/Lymphatic: Atherosclerotic calcification is present within
the non-aneurysmal abdominal aorta, without hemodynamically
significant stenosis. There is moderate to high-grade stenosis of
the bilateral renal arteries, right worse than left.

--No retroperitoneal lymphadenopathy.

--No mesenteric lymphadenopathy.

--No pelvic or inguinal lymphadenopathy.

Reproductive: Unremarkable

Other: No ascites or free air. There is a fat containing left
inguinal hernia.

Musculoskeletal. Multilevel degenerative changes are noted
throughout the lumbar spine resulting in multilevel spinal canal
stenosis. This is exacerbated by congenitally short pedicles.
IMPRESSION: 1. No acute abdominopelvic abnormality.
2. Large amount of stool throughout the colon, especially at the
level of the rectum.
3. Questionable filling defect within the urinary bladder.
Outpatient urology follow-up and cystoscopy is recommended.

Aortic Atherosclerosis ([id number].Z).

## 2023-11-27 NOTE — Telephone Encounter (Signed)
 So far as I can tell I only sent zyrtec it is available otc, by the children's bottle and give 5 ml (1 tsp nightly) or use good rx coupon, [facility] has a $4 coupon

## 2024-06-28 ENCOUNTER — Encounter (HOSPITAL_COMMUNITY): Payer: Self-pay

## 2024-06-28 ENCOUNTER — Inpatient Hospital Stay (HOSPITAL_COMMUNITY)
Admission: EM | Admit: 2024-06-28 | Discharge: 2024-07-02 | DRG: 871 | Disposition: A | Discharge status: Hospice | Attending: Internal Medicine | Admitting: Internal Medicine

## 2024-06-28 ENCOUNTER — Emergency Department (HOSPITAL_COMMUNITY)

## 2024-06-28 ENCOUNTER — Other Ambulatory Visit: Payer: Self-pay

## 2024-06-28 DIAGNOSIS — L89526 Pressure-induced deep tissue damage of left ankle: Secondary | ICD-10-CM | POA: Diagnosis present

## 2024-06-28 DIAGNOSIS — Z7189 Other specified counseling: Secondary | ICD-10-CM

## 2024-06-28 DIAGNOSIS — Z8719 Personal history of other diseases of the digestive system: Secondary | ICD-10-CM

## 2024-06-28 DIAGNOSIS — K625 Hemorrhage of anus and rectum: Secondary | ICD-10-CM | POA: Diagnosis present

## 2024-06-28 DIAGNOSIS — K922 Gastrointestinal hemorrhage, unspecified: Secondary | ICD-10-CM | POA: Diagnosis not present

## 2024-06-28 DIAGNOSIS — E876 Hypokalemia: Secondary | ICD-10-CM | POA: Diagnosis present

## 2024-06-28 DIAGNOSIS — R400 Somnolence: Secondary | ICD-10-CM | POA: Diagnosis not present

## 2024-06-28 DIAGNOSIS — A419 Sepsis, unspecified organism: Secondary | ICD-10-CM | POA: Diagnosis not present

## 2024-06-28 DIAGNOSIS — I350 Nonrheumatic aortic (valve) stenosis: Secondary | ICD-10-CM | POA: Diagnosis present

## 2024-06-28 DIAGNOSIS — Z711 Person with feared health complaint in whom no diagnosis is made: Secondary | ICD-10-CM | POA: Diagnosis not present

## 2024-06-28 DIAGNOSIS — N189 Chronic kidney disease, unspecified: Secondary | ICD-10-CM

## 2024-06-28 DIAGNOSIS — A0811 Acute gastroenteropathy due to Norwalk agent: Secondary | ICD-10-CM | POA: Diagnosis present

## 2024-06-28 DIAGNOSIS — L8990 Pressure ulcer of unspecified site, unspecified stage: Secondary | ICD-10-CM | POA: Diagnosis present

## 2024-06-28 DIAGNOSIS — F0280 Dementia in other diseases classified elsewhere without behavioral disturbance: Secondary | ICD-10-CM | POA: Diagnosis present

## 2024-06-28 DIAGNOSIS — I129 Hypertensive chronic kidney disease with stage 1 through stage 4 chronic kidney disease, or unspecified chronic kidney disease: Secondary | ICD-10-CM | POA: Diagnosis present

## 2024-06-28 DIAGNOSIS — Z9185 Personal history of military service: Secondary | ICD-10-CM

## 2024-06-28 DIAGNOSIS — F4310 Post-traumatic stress disorder, unspecified: Secondary | ICD-10-CM | POA: Diagnosis present

## 2024-06-28 DIAGNOSIS — L89111 Pressure ulcer of right upper back, stage 1: Secondary | ICD-10-CM | POA: Diagnosis present

## 2024-06-28 DIAGNOSIS — N400 Enlarged prostate without lower urinary tract symptoms: Secondary | ICD-10-CM | POA: Diagnosis present

## 2024-06-28 DIAGNOSIS — G309 Alzheimer's disease, unspecified: Secondary | ICD-10-CM | POA: Diagnosis present

## 2024-06-28 DIAGNOSIS — Z79899 Other long term (current) drug therapy: Secondary | ICD-10-CM

## 2024-06-28 DIAGNOSIS — A4159 Other Gram-negative sepsis: Secondary | ICD-10-CM | POA: Diagnosis present

## 2024-06-28 DIAGNOSIS — R7401 Elevation of levels of liver transaminase levels: Secondary | ICD-10-CM | POA: Diagnosis present

## 2024-06-28 DIAGNOSIS — Z6827 Body mass index (BMI) 27.0-27.9, adult: Secondary | ICD-10-CM

## 2024-06-28 DIAGNOSIS — N179 Acute kidney failure, unspecified: Secondary | ICD-10-CM | POA: Diagnosis present

## 2024-06-28 DIAGNOSIS — E860 Dehydration: Secondary | ICD-10-CM | POA: Diagnosis present

## 2024-06-28 DIAGNOSIS — E872 Acidosis, unspecified: Secondary | ICD-10-CM | POA: Diagnosis present

## 2024-06-28 DIAGNOSIS — Z515 Encounter for palliative care: Secondary | ICD-10-CM

## 2024-06-28 DIAGNOSIS — R627 Adult failure to thrive: Secondary | ICD-10-CM | POA: Diagnosis present

## 2024-06-28 DIAGNOSIS — E870 Hyperosmolality and hypernatremia: Secondary | ICD-10-CM | POA: Diagnosis present

## 2024-06-28 DIAGNOSIS — N1831 Chronic kidney disease, stage 3a: Secondary | ICD-10-CM | POA: Diagnosis present

## 2024-06-28 DIAGNOSIS — R4182 Altered mental status, unspecified: Secondary | ICD-10-CM | POA: Diagnosis not present

## 2024-06-28 DIAGNOSIS — K2971 Gastritis, unspecified, with bleeding: Secondary | ICD-10-CM

## 2024-06-28 DIAGNOSIS — J9601 Acute respiratory failure with hypoxia: Secondary | ICD-10-CM | POA: Diagnosis present

## 2024-06-28 DIAGNOSIS — Z66 Do not resuscitate: Secondary | ICD-10-CM | POA: Diagnosis present

## 2024-06-28 DIAGNOSIS — L89891 Pressure ulcer of other site, stage 1: Secondary | ICD-10-CM | POA: Diagnosis present

## 2024-06-28 DIAGNOSIS — R6520 Severe sepsis without septic shock: Secondary | ICD-10-CM | POA: Diagnosis present

## 2024-06-28 DIAGNOSIS — N390 Urinary tract infection, site not specified: Secondary | ICD-10-CM | POA: Diagnosis present

## 2024-06-28 DIAGNOSIS — L89616 Pressure-induced deep tissue damage of right heel: Secondary | ICD-10-CM | POA: Diagnosis present

## 2024-06-28 DIAGNOSIS — E8729 Other acidosis: Secondary | ICD-10-CM | POA: Insufficient documentation

## 2024-06-28 DIAGNOSIS — K648 Other hemorrhoids: Secondary | ICD-10-CM | POA: Diagnosis present

## 2024-06-28 DIAGNOSIS — L89150 Pressure ulcer of sacral region, unstageable: Secondary | ICD-10-CM | POA: Diagnosis present

## 2024-06-28 DIAGNOSIS — E785 Hyperlipidemia, unspecified: Secondary | ICD-10-CM | POA: Diagnosis present

## 2024-06-28 DIAGNOSIS — G9341 Metabolic encephalopathy: Secondary | ICD-10-CM | POA: Diagnosis present

## 2024-06-28 LAB — BLOOD CULTURE ID PANEL (REFLEXED) - BCID2

## 2024-06-28 LAB — COMPREHENSIVE METABOLIC PANEL WITH GFR
ALT: 34 U/L (ref 0–44)
ALT: 54 U/L — ABNORMAL HIGH (ref 0–44)
AST: 65 U/L — ABNORMAL HIGH (ref 15–41)
AST: 102 U/L — ABNORMAL HIGH (ref 15–41)
Albumin: <1.5 g/dL — ABNORMAL LOW (ref 3.5–5.0)
Albumin: 2.5 g/dL — ABNORMAL LOW (ref 3.5–5.0)
Alkaline Phosphatase: 92 U/L (ref 38–126)
Alkaline Phosphatase: 136 U/L — ABNORMAL HIGH (ref 38–126)
Anion gap: 16 — ABNORMAL HIGH (ref 5–15)
Anion gap: 14 (ref 5–15)
BUN: 77 mg/dL — ABNORMAL HIGH (ref 8–23)
BUN: 99 mg/dL — ABNORMAL HIGH (ref 8–23)
CO2: 13 mmol/L — ABNORMAL LOW (ref 22–32)
CO2: 21 mmol/L — ABNORMAL LOW (ref 22–32)
Calcium: 7.2 mg/dL — ABNORMAL LOW (ref 8.9–10.3)
Calcium: 7.9 mg/dL — ABNORMAL LOW (ref 8.9–10.3)
Chloride: 114 mmol/L — ABNORMAL HIGH (ref 98–111)
Chloride: 116 mmol/L — ABNORMAL HIGH (ref 98–111)
Creatinine, Ser: 3.27 mg/dL — ABNORMAL HIGH (ref 0.61–1.24)
Creatinine, Ser: 4.27 mg/dL — ABNORMAL HIGH (ref 0.61–1.24)
GFR, Estimated: 18 mL/min — ABNORMAL LOW (ref >60)
GFR, Estimated: 13 mL/min — ABNORMAL LOW (ref >60)
Glucose, Bld: 106 mg/dL — ABNORMAL HIGH (ref 70–99)
Glucose, Bld: 131 mg/dL — ABNORMAL HIGH (ref 70–99)
Potassium: 4.1 mmol/L (ref 3.5–5.1)
Potassium: 4.8 mmol/L (ref 3.5–5.1)
Sodium: 143 mmol/L (ref 135–145)
Sodium: 151 mmol/L — ABNORMAL HIGH (ref 135–145)
Total Bilirubin: 1.2 mg/dL (ref 0.0–1.2)
Total Bilirubin: 2.1 mg/dL — ABNORMAL HIGH (ref 0.0–1.2)
Total Protein: 3.9 g/dL — ABNORMAL LOW (ref 6.5–8.1)
Total Protein: 5.8 g/dL — ABNORMAL LOW (ref 6.5–8.1)

## 2024-06-28 LAB — CBC WITH DIFFERENTIAL/PLATELET
Abs Immature Granulocytes: 0.13 K/uL — ABNORMAL HIGH (ref 0.00–0.07)
Basophils Absolute: 0 K/uL (ref 0.0–0.1)
Basophils Relative: 0 %
Eosinophils Absolute: 0 K/uL (ref 0.0–0.5)
Eosinophils Relative: 0 %
HCT: 47.2 % (ref 39.0–52.0)
Hemoglobin: 15 g/dL (ref 13.0–17.0)
Immature Granulocytes: 1 %
Lymphocytes Relative: 4 %
Lymphs Abs: 0.8 K/uL (ref 0.7–4.0)
MCH: 33.2 pg (ref 26.0–34.0)
MCHC: 31.8 g/dL (ref 30.0–36.0)
MCV: 104.4 fL — ABNORMAL HIGH (ref 80.0–100.0)
Monocytes Absolute: 1 K/uL (ref 0.1–1.0)
Monocytes Relative: 5 %
Neutro Abs: 18.2 K/uL — ABNORMAL HIGH (ref 1.7–7.7)
Neutrophils Relative %: 90 %
Platelets: 156 K/uL (ref 150–400)
RBC: 4.52 MIL/uL (ref 4.22–5.81)
RDW: 16.2 % — ABNORMAL HIGH (ref 11.5–15.5)
WBC: 20.1 K/uL — ABNORMAL HIGH (ref 4.0–10.5)
nRBC: 0 % (ref 0.0–0.2)

## 2024-06-28 LAB — BASIC METABOLIC PANEL WITH GFR
Anion gap: 12 (ref 5–15)
BUN: 87 mg/dL — ABNORMAL HIGH (ref 8–23)
CO2: 21 mmol/L — ABNORMAL LOW (ref 22–32)
Calcium: 7.7 mg/dL — ABNORMAL LOW (ref 8.9–10.3)
Chloride: 116 mmol/L — ABNORMAL HIGH (ref 98–111)
Creatinine, Ser: 3.46 mg/dL — ABNORMAL HIGH (ref 0.61–1.24)
GFR, Estimated: 17 mL/min — ABNORMAL LOW (ref >60)
Glucose, Bld: 152 mg/dL — ABNORMAL HIGH (ref 70–99)
Potassium: 3.4 mmol/L — ABNORMAL LOW (ref 3.5–5.1)
Sodium: 149 mmol/L — ABNORMAL HIGH (ref 135–145)

## 2024-06-28 LAB — URINALYSIS, ROUTINE W REFLEX MICROSCOPIC
Bilirubin Urine: NEGATIVE
Glucose, UA: 50 mg/dL — AB
Ketones, ur: NEGATIVE mg/dL
Nitrite: NEGATIVE
Protein, ur: 30 mg/dL — AB
RBC / HPF: >50 RBC/hpf (ref 0–5)
Specific Gravity, Urine: 1.011 (ref 1.005–1.030)
pH: 6 (ref 5.0–8.0)

## 2024-06-28 LAB — C DIFFICILE QUICK SCREEN W PCR REFLEX
C Diff antigen: NEGATIVE
C Diff interpretation: NOT DETECTED
C Diff toxin: NEGATIVE

## 2024-06-28 LAB — CBC
HCT: 38.4 % — ABNORMAL LOW (ref 39.0–52.0)
Hemoglobin: 12.3 g/dL — ABNORMAL LOW (ref 13.0–17.0)
MCH: 32.6 pg (ref 26.0–34.0)
MCHC: 32 g/dL (ref 30.0–36.0)
MCV: 101.9 fL — ABNORMAL HIGH (ref 80.0–100.0)
Platelets: 109 K/uL — ABNORMAL LOW (ref 150–400)
RBC: 3.77 MIL/uL — ABNORMAL LOW (ref 4.22–5.81)
RDW: 15.5 % (ref 11.5–15.5)
WBC: 17.6 K/uL — ABNORMAL HIGH (ref 4.0–10.5)
nRBC: 0 % (ref 0.0–0.2)

## 2024-06-28 LAB — GASTROINTESTINAL PANEL BY PCR, STOOL (REPLACES STOOL CULTURE)

## 2024-06-28 LAB — I-STAT CG4 LACTIC ACID, ED
Lactic Acid, Venous: 3.5 mmol/L (ref 0.5–1.9)
Lactic Acid, Venous: 4.8 mmol/L (ref 0.5–1.9)

## 2024-06-28 LAB — TYPE AND SCREEN
ABO/RH(D): B POS
Antibody Screen: NEGATIVE

## 2024-06-28 LAB — LACTIC ACID, PLASMA
Lactic Acid, Venous: 2.5 mmol/L (ref 0.5–1.9)
Lactic Acid, Venous: 1.5 mmol/L (ref 0.5–1.9)

## 2024-06-28 LAB — CK: Total CK: 892 U/L — ABNORMAL HIGH (ref 49–397)

## 2024-06-28 LAB — PROCALCITONIN: Procalcitonin: 1.19 ng/mL

## 2024-06-28 LAB — POC OCCULT BLOOD, ED: Fecal Occult Bld: POSITIVE — AB

## 2024-06-28 LAB — PROTIME-INR
INR: 1.3 — ABNORMAL HIGH (ref 0.8–1.2)
Prothrombin Time: 17.1 s — ABNORMAL HIGH (ref 11.4–15.2)

## 2024-06-28 MED ORDER — ONDANSETRON HCL 4 MG PO TABS: 4.0000 mg | ORAL_TABLET | Freq: Four times a day (QID) | ORAL | Status: DC | PRN

## 2024-06-28 MED ORDER — SODIUM CHLORIDE 0.9% FLUSH
3.0000 mL | Freq: Two times a day (BID) | INTRAVENOUS | Status: DC
  Administered 2024-06-28 – 2024-07-01 (×7): 3 mL via INTRAVENOUS

## 2024-06-28 MED ORDER — ONDANSETRON HCL 4 MG/2ML IJ SOLN: 4.0000 mg | Freq: Four times a day (QID) | INTRAMUSCULAR | Status: DC | PRN

## 2024-06-28 MED ORDER — VANCOMYCIN HCL 1500 MG/300ML IV SOLN
1500.0000 mg | Freq: Once | INTRAVENOUS | Status: AC
  Administered 2024-06-28: 1500 mg via INTRAVENOUS
  Filled 2024-06-28: qty 300

## 2024-06-28 MED ORDER — SODIUM CHLORIDE 0.9 % IV SOLN: 2.0000 g | INTRAVENOUS | Status: DC

## 2024-06-28 MED ORDER — LACTATED RINGERS IV BOLUS
1000.0000 mL | Freq: Once | INTRAVENOUS | Status: AC
  Administered 2024-06-28: 1000 mL via INTRAVENOUS

## 2024-06-28 MED ORDER — HALOPERIDOL LACTATE 5 MG/ML IJ SOLN: 1.0000 mg | Freq: Four times a day (QID) | INTRAMUSCULAR | Status: DC | PRN

## 2024-06-28 MED ORDER — CHLORHEXIDINE GLUCONATE CLOTH 2 % EX PADS
6.0000 | MEDICATED_PAD | Freq: Every day | CUTANEOUS | Status: DC
  Administered 2024-06-28 – 2024-06-29 (×2): 6 via TOPICAL

## 2024-06-28 MED ORDER — SODIUM CHLORIDE 0.9 % IV SOLN
2.0000 g | Freq: Once | INTRAVENOUS | Status: AC
  Administered 2024-06-28: 2 g via INTRAVENOUS
  Filled 2024-06-28: qty 12.5

## 2024-06-28 MED ORDER — DONEPEZIL HCL 10 MG PO TABS: 5.0000 mg | ORAL_TABLET | Freq: Every day | ORAL | Status: DC

## 2024-06-28 MED ORDER — ACETAMINOPHEN 650 MG RE SUPP: 650.0000 mg | Freq: Four times a day (QID) | RECTAL | Status: DC | PRN

## 2024-06-28 MED ORDER — HYDROCORTISONE (PERIANAL) 2.5 % EX CREA
1.0000 | TOPICAL_CREAM | Freq: Two times a day (BID) | CUTANEOUS | Status: DC
  Administered 2024-06-28 – 2024-07-02 (×7): 1 via RECTAL
  Filled 2024-06-28 (×2): qty 28.35

## 2024-06-28 MED ORDER — DIAZEPAM 5 MG/ML IJ SOLN: 2.5000 mg | Freq: Four times a day (QID) | INTRAMUSCULAR | Status: DC | PRN

## 2024-06-28 MED ORDER — ALBUMIN HUMAN 25 % IV SOLN
25.0000 g | INTRAVENOUS | Status: AC
  Administered 2024-06-28: 12.5 g via INTRAVENOUS
  Filled 2024-06-28: qty 100

## 2024-06-28 MED ORDER — CITALOPRAM HYDROBROMIDE 10 MG PO TABS: 10.0000 mg | ORAL_TABLET | Freq: Every day | ORAL | Status: DC

## 2024-06-28 MED ORDER — SODIUM CHLORIDE 0.9 % IV SOLN: 250.0000 mL | INTRAVENOUS | Status: AC | PRN

## 2024-06-28 MED ORDER — QUETIAPINE FUMARATE 25 MG PO TABS: 25.0000 mg | ORAL_TABLET | Freq: Every day | ORAL | Status: DC

## 2024-06-28 MED ORDER — DEXTROSE 5 % IV SOLN: INTRAVENOUS | Status: AC

## 2024-06-28 MED ORDER — SODIUM BICARBONATE 8.4 % IV SOLN
100.0000 meq | Freq: Once | INTRAVENOUS | Status: AC
  Administered 2024-06-28: 100 meq via INTRAVENOUS
  Filled 2024-06-28: qty 100

## 2024-06-28 MED ORDER — LACTATED RINGERS IV SOLN: INTRAVENOUS | Status: DC

## 2024-06-28 MED ORDER — ATORVASTATIN CALCIUM 10 MG PO TABS: 20.0000 mg | ORAL_TABLET | Freq: Every day | ORAL | Status: DC

## 2024-06-28 MED ORDER — SODIUM CHLORIDE 0.9 % IV SOLN
2.0000 g | INTRAVENOUS | Status: DC
  Administered 2024-06-28 – 2024-06-29 (×2): 2 g via INTRAVENOUS
  Filled 2024-06-28 (×2): qty 20

## 2024-06-28 MED ORDER — SODIUM CHLORIDE 0.9% FLUSH
3.0000 mL | Freq: Two times a day (BID) | INTRAVENOUS | Status: DC
  Administered 2024-06-28 – 2024-07-01 (×6): 3 mL via INTRAVENOUS

## 2024-06-28 MED ORDER — SODIUM CHLORIDE 0.9% FLUSH: 3.0000 mL | INTRAVENOUS | Status: DC | PRN

## 2024-06-28 MED ORDER — VANCOMYCIN VARIABLE DOSE PER UNSTABLE RENAL FUNCTION (PHARMACIST DOSING): Status: DC

## 2024-06-28 MED ORDER — ACETAMINOPHEN 325 MG PO TABS
650.0000 mg | ORAL_TABLET | Freq: Four times a day (QID) | ORAL | Status: DC | PRN
  Administered 2024-07-02: 650 mg via ORAL
  Filled 2024-06-28: qty 2

## 2024-06-28 MED ORDER — ENSURE PO LIQD: 237.0000 mL | Freq: Two times a day (BID) | ORAL | Status: DC

## 2024-06-28 MED ORDER — PANTOPRAZOLE SODIUM 40 MG IV SOLR
40.0000 mg | Freq: Two times a day (BID) | INTRAVENOUS | Status: DC
  Administered 2024-06-28 – 2024-07-02 (×10): 40 mg via INTRAVENOUS
  Filled 2024-06-28 (×10): qty 10

## 2024-06-28 MED ORDER — LACTATED RINGERS IV BOLUS
500.0000 mL | Freq: Once | INTRAVENOUS | Status: AC
  Administered 2024-06-28: 500 mL via INTRAVENOUS

## 2024-06-28 NOTE — H&P (Signed)
 History and Physical    Marc [name] FMW:[id number] DOB: July 23, 1941 DOA: 06/28/2024  PCP: [name], MD   Patient coming from: Home   Chief Complaint:  Chief Complaint  Patient presents with   Rectal Bleeding   ED TRIAGE note:  Patient BIB [facility] from home. Family states they were changing patient's brief and noticed dark red blood, first started today. Upon arrival patient hypotensive 80's systolic. After 700 NS bp 135/75. Hx of dementia. Hx of GI bleed, no thinners.      HPI:  Marc [name] is a 83 y.o. male with medical history significant of PTSD, dementia, CKD stage IIIa, hyperlipidemia, Alzheimer disease, essential hypertension, aortic stenosis, ventricular hypertrophy, prior GI bleed 2019 required blood transfusion underwent EGD showing esophagitis and colonoscopy showed hemorrhoid.  Patient presented to emergency department with complaining of bright red blood per rectum.  Family reported that patient has ongoing diarrhea for almost 1 month, poor appetite, and dehydration..  Family reported that patient has baseline dementia with confusion however for last few days patient has been going downhill.  In the ED physical exam did not reveal any active GI bleed per rectum.  In the ED with ED physician Dr. Jerral and family had a long conversation which indicated patient's family does not want to pursue any intervention or medical therapy and patient has been elected to be DNR and goal is to keep patient comfortable at this phase of his life.  Family stated that they are giving consent for blood transfusion if indicated.  Per discussion of ED physician and family's following: Patient's family arrived approximately 5 minutes after patient's arrival.  They stated they wanted to get back to his care so that they could talk about his scope with therapy. Patient's son states that patient has had a very hard life.  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  They state that he has had a lifelong problem with PTSD.  They state that since he has had dementia developing and worsening over the past year, the PTSD has caused him to be substantially uncomfortable on a day-to-day basis. They state that over the last month he has had a chronic diarrhea and over the last year he has had an uncontrollable recurrent GI bleed.  They state they have been thinking about his scope with therapy and have been anticipating that he would have a day such as today where he had a another severe GI bleed. Patient's son who is the healthcare power of attorney is at bedside and states that he would want his father to be DO NOT INTUBATE DO NOT RESUSCITATE but would still pursue full medical therapy at this time. They did consent for blood administration if indicated. Both patient's son and patient's daughter-in-law state they are goal is to make the patient comfortable at this phase in his life.  Family has has been wished for all treatment include IV fluid, antibiotic, blood transfusion if needed however no chest compressions/intubation/invasive intervention like EGD or colonoscopy.  During my evaluation at the bedside patient is not responding to any voice command and not able to answer any questions.  History gathered from chart review, ED physician note and from patient's son over phone.  ED Course:  At presentation to ED patient was hypotensive otherwise hemodynamically stable. Elevated lactic acid 4.8 trended down to 3.5.  Blood cultures are in process.  CMP showing low bicarb 13, elevated creatinine 3.27, decreased albumin , elevated AST 65, elevated anion gap 16 and low GFR's 18. Elevated pro time INR. FOBT positive. CBC showing leukocytosis 20.1, stable H&H 15 and 47 and normal platelet count.  CT abdomen pelvis showed irregular bladder wall thickening concern for cystitis otherwise no acute  intra-abdominal finding. Pending UA and urine culture.  In the ED patient has been given 1 L of LR bolus and currently on cefepime  and vancomycin  for management of sepsis.  Unknown source of infection at this time  Hospitalist has been consulted for further evaluation management of GI bleed, sepsis, diarrhea, acute kidney injury, hypotension, and uncommitted Bilik acidosis.    Significant labs in the ED: Lab Orders         Urine Culture         Blood culture (routine x 2)         Gastrointestinal Panel by PCR , Stool         C Difficile Quick Screen w PCR reflex         CBC with Differential         Protime-INR         Comprehensive metabolic panel with GFR         Urinalysis, Routine w reflex microscopic -Urine, Clean Catch         Comprehensive metabolic panel         CBC         Lactic acid, plasma         I-Stat CG4 Lactic Acid         POC occult blood, ED       Review of Systems:  Review of Systems  Unable to perform ROS: Mental status change    Past Medical History:  Diagnosis Date   BPH (benign prostatic hypertrophy)    WITH MICROSCOPIC HEMATURIA   Hematuria    Hyperlipidemia    Hypertension    has severe LVH per echo in November 2012    Past Surgical History:  Procedure Laterality Date   COLONOSCOPY WITH PROPOFOL  N/A 01/09/2018   Procedure: COLONOSCOPY WITH PROPOFOL ;  Surgeon: Elicia Claw, MD;  Location: [facility];  Service: Gastroenterology;  Laterality: N/A;   ESOPHAGOGASTRODUODENOSCOPY (EGD) WITH PROPOFOL  N/A 01/08/2018   Procedure: ESOPHAGOGASTRODUODENOSCOPY (EGD) WITH PROPOFOL ;  Surgeon: Elicia Claw, MD;  Location: [facility];  Service: Gastroenterology;  Laterality: N/A;     reports that he has never smoked. He has never used smokeless tobacco. He reports current alcohol use. He reports that he does not use drugs.  No Known Allergies  Family History  Family history unknown: Yes    Prior to Admission medications   Medication Sig  Start Date End Date Taking? Authorizing Provider  atorvastatin  (LIPITOR) 20 MG tablet TAKE 1 TABLET BY MOUTH DAILY 06/07/20   Lelon Hamilton T, PA-C  carvedilol  (COREG ) 3.125 MG tablet TAKE 1 TABLET BY MOUTH TWICE DAILY WITH MEALS 06/09/20   Weaver, Scott T, PA-C  citalopram  (CELEXA ) 10 MG tablet Take 10 mg by mouth daily.    [provider]  donepezil  (ARICEPT ) 5 MG tablet TAKE 1 TABLET(5 MG) BY MOUTH AT BEDTIME 01/28/18   End, Lonni, MD  Ensure (ENSURE) Take 237 mLs by mouth 2 (two) times daily between meals.    [provider]  hydrochlorothiazide  (HYDRODIURIL ) 25 MG tablet TAKE 1 TABLET BY MOUTH DAILY 06/09/20   Lelon Hamilton T, PA-C  hydrocortisone  (ANUSOL -HC) 2.5 % rectal cream Place  1 application rectally 2 (two) times daily. 08/29/20   Geroldine Berg, MD  hydrocortisone  (ANUSOL -HC) 25 MG suppository Place 25 mg rectally 2 (two) times daily.    [provider]  QUEtiapine  (SEROQUEL ) 25 MG tablet Take 25 mg by mouth daily.    [provider]  silodosin  (RAPAFLO ) 8 MG CAPS capsule TAKE 1 CAPSULE(8 MG) BY MOUTH DAILY 06/16/19   Marcine Caffie HERO, PA-C     Physical Exam: Vitals:   06/28/24 0300 06/28/24 0315 06/28/24 0330 06/28/24 0415  BP: (!) 88/77 (!) 99/57 99/76 (!) 103/52  Pulse: 91 78 80   Resp: 15 16 12    Temp:      TempSrc:      SpO2: 100% 100% 100%   Weight:      Height:        Physical Exam Vitals and nursing note reviewed.  Constitutional:      General: He is not in acute distress.    Appearance: He is ill-appearing.  HENT:     Mouth/Throat:     Mouth: Mucous membranes are dry.  Eyes:     Pupils: Pupils are equal, round, and reactive to light.  Cardiovascular:     Rate and Rhythm: Normal rate and regular rhythm.     Pulses: Normal pulses.     Heart sounds: Normal heart sounds.  Pulmonary:     Effort: Pulmonary effort is normal.     Breath sounds: Normal breath sounds.  Abdominal:     Palpations: Abdomen is soft.   Musculoskeletal:     Cervical back: Neck supple.     Right lower leg: No edema.     Left lower leg: No edema.  Skin:    General: Skin is dry.     Capillary Refill: Capillary refill takes less than 2 seconds.  Neurological:     Comments: Altered mental status and poor response to pain and voice command.      Labs on Admission: I have personally reviewed following labs and imaging studies  CBC: Recent Labs  Lab 06/28/24 0009  WBC 20.1*  NEUTROABS 18.2*  HGB 15.0  HCT 47.2  MCV 104.4*  PLT 156   Basic Metabolic Panel: Recent Labs  Lab 06/28/24 0045  NA 143  K 4.1  CL 114*  CO2 13*  GLUCOSE 106*  BUN 77*  CREATININE 3.27*  CALCIUM  7.2*   GFR: Estimated Creatinine Clearance: 15.9 mL/min (A) (by C-G formula based on SCr of 3.27 mg/dL (H)). Liver Function Tests: Recent Labs  Lab 06/28/24 0045  AST 65*  ALT 34  ALKPHOS 92  BILITOT 1.2  PROT 3.9*  ALBUMIN  <1.5*   No results for input(s): LIPASE, AMYLASE in the last 168 hours. No results for input(s): AMMONIA in the last 168 hours. Coagulation Profile: Recent Labs  Lab 06/28/24 0045  INR 1.3*   Cardiac Enzymes: No results for input(s): CKTOTAL, CKMB, CKMBINDEX, TROPONINI, TROPONINIHS in the last 168 hours. BNP (last 3 results) No results for input(s): BNP in the last 8760 hours. HbA1C: No results for input(s): HGBA1C in the last 72 hours. CBG: No results for input(s): GLUCAP in the last 168 hours. Lipid Profile: No results for input(s): CHOL, HDL, LDLCALC, TRIG, CHOLHDL, LDLDIRECT in the last 72 hours. Thyroid Function Tests: No results for input(s): TSH, T4TOTAL, FREET4, T3FREE, THYROIDAB in the last 72 hours. Anemia Panel: No results for input(s): VITAMINB12, FOLATE, FERRITIN, TIBC, IRON, RETICCTPCT in the last 72 hours. Urine analysis:    Component  Value Date/Time   COLORURINE YELLOW 01/07/2018 1714   APPEARANCEUR CLEAR 01/07/2018 1714    LABSPEC 1.012 01/07/2018 1714   PHURINE 5.0 01/07/2018 1714   GLUCOSEU NEGATIVE 01/07/2018 1714   HGBUR NEGATIVE 01/07/2018 1714   BILIRUBINUR NEGATIVE 01/07/2018 1714   KETONESUR NEGATIVE 01/07/2018 1714   PROTEINUR NEGATIVE 01/07/2018 1714   UROBILINOGEN 0.2 09/26/2011 2053   NITRITE NEGATIVE 01/07/2018 1714   LEUKOCYTESUR NEGATIVE 01/07/2018 1714    Radiological Exams on Admission: I have personally reviewed images CT ABDOMEN PELVIS WO CONTRAST Result Date: 06/28/2024 EXAM: CT ABDOMEN AND PELVIS WITHOUT CONTRAST 06/28/2024 01:45:50 AM TECHNIQUE: CT of the abdomen and pelvis was performed without the administration of intravenous contrast. Multiplanar reformatted images are provided for review. Automated exposure control, iterative reconstruction, and/or weight based adjustment of the mA/kV was utilized to reduce the radiation dose to as low as reasonably achievable. COMPARISON: CT from 08/29/2020. CLINICAL HISTORY: Abdominal pain, acute, nonlocalized. Patient BIB [facility] from home. Family states they were changing patient's brief and noticed dark red blood, first started today. FINDINGS: LOWER CHEST: No acute abnormality. LIVER: The liver is unremarkable. GALLBLADDER AND BILE DUCTS: Gallbladder is unremarkable. No biliary ductal dilatation. SPLEEN: No acute abnormality. PANCREAS: No acute abnormality. ADRENAL GLANDS: No acute abnormality. KIDNEYS, URETERS AND BLADDER: Multiple bilateral renal cystic lesions, some of which are mildly hyperdense. These were compatible with benign cysts on CT from 08/29/2020. The mild hyperdensity may be due to hemorrhagic or proteinaceous transformation. No follow-up recommended. Irregular bladder wall thickening. Correlate with urinalysis for cystitis and consider cystoscopy to evaluate for mass. Evaluation for active bleeding is not possible on noncontrast exam. No hydronephrosis. No perinephric or periureteral stranding. GI AND BOWEL: Stomach demonstrates no acute  abnormality. There is no bowel obstruction. No bowel wall thickening. PERITONEUM AND RETROPERITONEUM: No ascites. No free air. VASCULATURE: Aortic atherosclerotic calcification. LYMPH NODES: No lymphadenopathy. REPRODUCTIVE ORGANS: No acute abnormality. BONES AND SOFT TISSUES: No acute osseous abnormality. No focal soft tissue abnormality. IMPRESSION: 1. Irregular bladder wall thickening. Correlate with urinalysis for cystitis and consider cystoscopy to evaluate for mass. Electronically signed by: Norman Gatlin MD 06/28/2024 01:56 AM EDT RP Workstation: [id number]     EKG: My personal interpretation of EKG shows:Normal sinus rhythm heart rate 97, left anterior fascicular block.  Assessment/Plan: Principal Problem:   Sepsis (HCC) Active Problems:   Altered mental status   Acute kidney injury superimposed on chronic kidney disease (HCC)   Alzheimer's disease (HCC)   GI bleed   High anion gap metabolic acidosis   PTSD (post-traumatic stress disorder)   Aortic stenosis   History of GI bleed    Assessment and Plan: Sepsis-unknown source of infection at this time -Presented to emergency department accompanied by family as family noticed blood in the pants/diaper.  Per family patient has history of Alzheimer disease with dementia and he has gradually declining in the terms of mental status.  History of severe PTSD and dementia.  Family reported patient has diarrhea for 1 month. -As presentation to ED patient has altered mental status.  Hypotensive.  Afebrile.Elevated lactic acid 4.8 trended down to 3.5.  Blood cultures are in process.CMP showing low bicarb 13, elevated creatinine 3.27, decreased albumin , elevated AST 65, elevated anion gap 16 and low GFR's 18. Elevated pro time INR. FOBT positive. CBC showing leukocytosis 20.1, stable H&H 15 and 47 and normal platelet count.CT abdomen pelvis showed irregular bladder wall thickening concern for cystitis otherwise no acute intra-abdominal  finding. Pending UA and urine  culture. -Concern for sepsis either in the setting of UTI versus diarrhea.  Ankle etiology at this time. - Will continue broad-spectrum antibiotic coverage with IV Vanco and cefepime . -Continue sepsis protocol and resuscitating with 2.5 L of LR bolus and currently on maintenance fluid LR 150 cc/h. -Pending blood culture, UA and urine culture.  Checking GI panel and C. difficile panel.   Altered mental status-in the setting of sepsis -Altered mental status in the terms of not responding to question and pain.  Altered mental status in the setting of sepsis, AKI, diarrhea dehydration metabolic acidosis.  Continue to manage for everything as mentioned. Due to altered mental status keeping patient n.p.o. as high risk for aspiration. -Consulting palliative team to discuss further goal of care with patient's family.  Concern for possible demise during this hospital admission.  Acute kidney injury superimposed CKD stage  -Elevated creatinine 3.27.  Patient has not been urinated yet in the ED.  Baseline creatinine around 1.4-1.79.  Prerenal acute kidney injury in the setting of dehydration, poor oral intake and diarrhea. - In the ED resuscitated with 2.5 L of LR bolus.  Continue maintenance fluid LR 150 cc/h. -Placing Foley for strict I's/O.  Monitor renal function, avoid nephrotoxic agent and renally adjust medication. -Continue to trend lactic acid level and maintenance aggressive hydration.   Diarrhea -Checking GI and C. difficile panel.  GI bleed-likely secondary from hemorrhoid History of GI bleed-secondary to esophagitis and hemorrhoid -Family reported that they have noticed bleeding in the patient's diaper/pants.  Per chart review patient has history of internal hemorrhoid underwent EGD and colonoscopy in the past. Patient is hypotensive however stable H&H.  Family stated that they do not want to proceed with any extensive intervention like EGD colonoscopy  however willing to give blood transfusion if needed. - Obtaining type and screen. - Continue IV Protonix  and Anusol  suppository twice daily.  High anion gap metabolic acidosis -Low bicarb 13 and elevated anion gap 16.  Metabolic acidosis in the setting of diarrhea.  Giving IV bicarb 100 mEq.   Alzheimer disease PTSD -Due to altered mental status unable to give home medications.  Continue IV Haldol  as needed for agitation.  Essential hypertension History of aortic stenosis -Due to altered mental status all oral medication on hold.  Also due to sepsis holding blood pressure regimen.   DVT prophylaxis:  SCDs Code Status:  DNR/DNI(Do NOT Intubate).patient's son and daughter in law elected DNR status.  I spoke with patient's son over phone at 4:30 AM and updated as well.  Family will come to visit him in the morning. Diet: N.p.o. as high risk for aspiration Disposition Plan: Monitor improvement of mental status. Consults: Palliative team Admission status:   Inpatient, Step Down Unit  Severity of Illness: The appropriate patient status for this patient is INPATIENT. Inpatient status is judged to be reasonable and necessary in order to provide the required intensity of service to ensure the patient's safety. The patient's presenting symptoms, physical exam findings, and initial radiographic and laboratory data in the context of their chronic comorbidities is felt to place them at high risk for further clinical deterioration. Furthermore, it is not anticipated that the patient will be medically stable for discharge from the hospital within 2 midnights of admission.   * I certify that at the point of admission it is my clinical judgment that the patient will require inpatient hospital care spanning beyond 2 midnights from the point of admission due to high intensity of service, high  risk for further deterioration and high frequency of surveillance required.DEWAINE    Jari Dipasquale, MD [facility]  How to contact the [facility] Attending or Consulting provider 7A - 7P or covering provider during after hours 7P -7A, for this patient.  Check the care team in [facility] and look for a) attending/consulting [facility] provider listed and b) the [facility] team listed Log into [email or web address] and use [place]'s universal password to access. If you do not have the password, please contact the hospital operator. Locate the [facility] provider you are looking for under [facility] and page to a number that you can be directly reached. If you still have difficulty reaching the provider, please page the [facility] (Director on Call) for the Hospitalists listed on amion for assistance.  06/28/2024, 4:32 AM

## 2024-06-28 NOTE — ED Provider Notes (Signed)
 Additional note discussing CODE STATUS. Patient's family arrived approximately 5 minutes after patient's arrival.  They stated they wanted to get back to his care so that they could talk about his scope with therapy. Patient's son states that patient has had a very hard life.  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  They state that he has had a lifelong problem with PTSD.  They state that since he has had dementia developing and worsening over the past year, the PTSD has caused him to be substantially uncomfortable on a day-to-day basis. They state that over the last month he has had a chronic diarrhea and over the last year he has had an uncontrollable recurrent GI bleed.  They state they have been thinking about his scope with therapy and have been anticipating that he would have a day such as today where he had a another severe GI bleed. Patient's son who is the healthcare power of attorney is at bedside and states that he would want his father to be DO NOT INTUBATE DO NOT RESUSCITATE but would still pursue full medical therapy at this time. They did consent for blood administration if indicated. Both patient's son and patient's daughter-in-law state that they are #1 goal would for him to be comfortable at this phase in his life.   Jerral Meth, MD 06/28/24 [phone]

## 2024-06-28 NOTE — Progress Notes (Signed)
 Pt arrived to unit from ED . VSS, A/O x 0,  CCMD called ,CHG given, pt has muti wounds all over body .    06/28/24 0900  Vitals  Temp (!) 97.4 F (36.3 C)  Temp Source Axillary  BP (!) 134/99  MAP (mmHg) 108  BP Location Right Arm  BP Method Automatic  Patient Position (if appropriate) Lying  Pulse Rate 74  Pulse Rate Source Monitor  ECG Heart Rate 75  Resp 14  Level of Consciousness  Level of Consciousness Alert  Pain Assessment  Pain Scale 0-10  Pain Score 0  MEWS Score  MEWS Temp 0  MEWS Systolic 0  MEWS Pulse 0  MEWS RR 0  MEWS LOC 0  MEWS Score 0  MEWS Score Color Green     Jon MARLA Baars, RN

## 2024-06-28 NOTE — Sepsis Progress Note (Signed)
 Elink following for sepsis protocol.

## 2024-06-28 NOTE — Progress Notes (Signed)
 PHARMACY - PHYSICIAN COMMUNICATION CRITICAL VALUE ALERT - BLOOD CULTURE IDENTIFICATION (BCID)  Marc [name] is an 83 y.o. male who presented to [facility] on 06/28/2024 with a chief complaint of rectal bleeding   Name of physician (or Provider) Contacted: Rai  Current antibiotics: cefepime  / vancomycin   Changes to prescribed antibiotics recommended:  Recommendations accepted by provider  Results for orders placed or performed during the hospital encounter of 06/28/24  Blood Culture ID Panel (Reflexed) (Collected: 06/28/2024  2:06 AM)  Result Value Ref Range   Enterococcus faecalis NOT DETECTED NOT DETECTED   Enterococcus Faecium NOT DETECTED NOT DETECTED   Listeria monocytogenes NOT DETECTED NOT DETECTED   Staphylococcus species NOT DETECTED NOT DETECTED   Staphylococcus aureus (BCID) NOT DETECTED NOT DETECTED   Staphylococcus epidermidis NOT DETECTED NOT DETECTED   Staphylococcus lugdunensis NOT DETECTED NOT DETECTED   Streptococcus species NOT DETECTED NOT DETECTED   Streptococcus agalactiae NOT DETECTED NOT DETECTED   Streptococcus pneumoniae NOT DETECTED NOT DETECTED   Streptococcus pyogenes NOT DETECTED NOT DETECTED   A.calcoaceticus-baumannii NOT DETECTED NOT DETECTED   Bacteroides fragilis NOT DETECTED NOT DETECTED   Enterobacterales DETECTED (A) NOT DETECTED   Enterobacter cloacae complex NOT DETECTED NOT DETECTED   Escherichia coli NOT DETECTED NOT DETECTED   Klebsiella aerogenes NOT DETECTED NOT DETECTED   Klebsiella oxytoca NOT DETECTED NOT DETECTED   Klebsiella pneumoniae NOT DETECTED NOT DETECTED   Proteus species DETECTED (A) NOT DETECTED   Salmonella species NOT DETECTED NOT DETECTED   Serratia marcescens NOT DETECTED NOT DETECTED   Haemophilus influenzae NOT DETECTED NOT DETECTED   Neisseria meningitidis NOT DETECTED NOT DETECTED   Pseudomonas aeruginosa NOT DETECTED NOT DETECTED   Stenotrophomonas maltophilia NOT DETECTED NOT DETECTED   Candida albicans NOT  DETECTED NOT DETECTED   Candida auris NOT DETECTED NOT DETECTED   Candida glabrata NOT DETECTED NOT DETECTED   Candida krusei NOT DETECTED NOT DETECTED   Candida parapsilosis NOT DETECTED NOT DETECTED   Candida tropicalis NOT DETECTED NOT DETECTED   Cryptococcus neoformans/gattii NOT DETECTED NOT DETECTED   CTX-M ESBL NOT DETECTED NOT DETECTED   Carbapenem resistance IMP NOT DETECTED NOT DETECTED   Carbapenem resistance KPC NOT DETECTED NOT DETECTED   Carbapenem resistance NDM NOT DETECTED NOT DETECTED   Carbapenem resist OXA 48 LIKE NOT DETECTED NOT DETECTED   Carbapenem resistance VIM NOT DETECTED NOT DETECTED    [name] 06/28/2024  5:32 PM

## 2024-06-28 NOTE — ED Notes (Signed)
Patient turned, cleaned, and changed at this time.

## 2024-06-28 NOTE — Progress Notes (Signed)
 Pharmacy Antibiotic Note  Marc [name] is a 83 y.o. male admitted on 06/28/2024 with altered mental status and rectal bleeding. PMH includes history of recurrent GI bleed, PTSD, dementia, Alzheimer's disease. Pharmacy has been consulted for cefepime /vancomycin  dosing for sepsis.  -WBC 20, afebrile, sCr 3.27 (bl~1.4-1.5)  -Cefepime /Vanc x1 -Blood cultures collected -CTabd: irregular bladder wall thickening  Plan: -Cefepime  2g IV every 24 hours -Vancomycin  1500mg  IV x1 -Vancomycin  variable dosing given renal function -Monitor renal function for Vancomycin  maintenance dosing -Follow up signs of clinical improvement, LOT, de-escalation of antibiotics   Height: 5' 4 (162.6 cm) Weight: 73 kg (160 lb 15 oz) IBW/kg (Calculated) : 59.2  Temp (24hrs), Avg:98.3 F (36.8 C), Min:98.3 F (36.8 C), Max:98.3 F (36.8 C)  Recent Labs  Lab 06/28/24 0009 06/28/24 0045 06/28/24 0051 06/28/24 0217  WBC 20.1*  --   --   --   CREATININE  --  3.27*  --   --   LATICACIDVEN  --   --  4.8* 3.5*    Estimated Creatinine Clearance: 15.9 mL/min (A) (by C-G formula based on SCr of 3.27 mg/dL (H)).    No Known Allergies  Antimicrobials this admission: Cefepime  8/3 >>  Vancomycin  8/3 >>   Microbiology results: 8/3 BCx:  8/3 UCx: ordered   Thank you for allowing pharmacy to be a part of this patient's care.  Lynwood Poplar, PharmD, BCPS Clinical Pharmacist 06/28/2024 2:41 AM

## 2024-06-28 NOTE — Progress Notes (Signed)
 [facility] [phone] [facility] Liaison Note  Received request from [name], NP with PMT and Reandra Blade, LCSW to discuss hospice services.  Met with son, Willene, at bedside and then moved to quiet room to discuss hospice services both at home and in the [facility]. Nang, understands information provided and requests 24 hours to see if patients responds to medical treatment and time to speak with his mother before making a decision for more comfort focused care. He does state that if comfort focused care is decided upon, they would prefer [facility].   Hospital Liaison Team will follow.  Please call with any hospice related questions or concerns.  Thank you, Randine Nail, BSN, [facility] [phone]
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
## 2024-06-28 NOTE — Progress Notes (Signed)
 ED Pharmacy Antibiotic Sign Off An antibiotic consult was received from an ED provider for cefepime /vancomycin  per pharmacy dosing for sepsis. A chart review was completed to assess appropriateness.   The following one time order(s) were placed:   -Cefepime  2g IV x1 -Vancomycin  1500 mg IV x1  Further antibiotic and/or antibiotic pharmacy consults should be ordered by the admitting provider if indicated.   Thank you for allowing pharmacy to be a part of this patient's care.   Lynwood Poplar, [facility]  Clinical Pharmacist 06/28/24 2:01 AM

## 2024-06-28 NOTE — ED Provider Notes (Signed)
 [place] EMERGENCY DEPARTMENT AT [facility] Provider Note   CSN: [id number] Arrival date & time: 06/28/24  0005     History Chief Complaint  Patient presents with   Rectal Bleeding    HPI Marc [name] is a 83 y.o. male presenting for altered mental status and bright red blood per rectum.  He was brought in by EMS for hypotension, unresponsiveness.  Family bedside quickly after his arrival.  See same-day note regarding intubation/resuscitation desires. They state that he has had a chronic diarrhea and recurrent GI bleed and that the current episode is more severe than prior.  They note that he became unresponsive prior to EMS arrival.   Patient's recorded medical, surgical, social, medication list and allergies were reviewed in the Snapshot window as part of the initial history.   Review of Systems   Review of Systems  Constitutional:  Negative for chills and fever.  HENT:  Negative for ear pain and sore throat.   Eyes:  Negative for pain and visual disturbance.  Respiratory:  Negative for cough and shortness of breath.   Cardiovascular:  Negative for chest pain and palpitations.  Gastrointestinal:  Positive for blood in stool. Negative for abdominal pain and vomiting.  Genitourinary:  Negative for dysuria and hematuria.  Musculoskeletal:  Negative for arthralgias and back pain.  Skin:  Negative for color change and rash.  Neurological:  Positive for weakness. Negative for seizures and syncope.  Psychiatric/Behavioral:  Positive for confusion.   All other systems reviewed and are negative.   Physical Exam Updated Vital Signs There were no vitals taken for this visit. Physical Exam Vitals and nursing note reviewed.  Constitutional:      General: He is not in acute distress.    Appearance: He is well-developed.  HENT:     Head: Normocephalic and atraumatic.  Eyes:     Conjunctiva/sclera: Conjunctivae normal.  Cardiovascular:     Rate and Rhythm: Normal rate  and regular rhythm.     Heart sounds: No murmur heard. Pulmonary:     Effort: Pulmonary effort is normal. No respiratory distress.     Breath sounds: Normal breath sounds.  Abdominal:     Palpations: Abdomen is soft.     Tenderness: There is no abdominal tenderness.  Musculoskeletal:        General: No swelling.     Cervical back: Neck supple.  Skin:    General: Skin is warm and dry.     Capillary Refill: Capillary refill takes less than 2 seconds.  Neurological:     Mental Status: He is alert.  Psychiatric:        Mood and Affect: Mood normal.      ED Course/ Medical Decision Making/ A&P    Procedures .Critical Care  Performed by: Jerral Meth, MD Authorized by: Jerral Meth, MD   Critical care provider statement:    Critical care time (minutes):  90   Critical care was necessary to treat or prevent imminent or life-threatening deterioration of the following conditions:  Circulatory failure   Critical care was time spent personally by me on the following activities:  Development of treatment plan with patient or surrogate, discussions with consultants, evaluation of patient's response to treatment, examination of patient, ordering and review of laboratory studies, ordering and review of radiographic studies, ordering and performing treatments and interventions, pulse oximetry, re-evaluation of patient's condition, review of old charts and obtaining history from patient or surrogate Comments:     End-of-life  discussions.    Medications Ordered in ED Medications - No data to display Medical Decision Making:   Marc [name] is a 83 y.o. male who presented to the ED today with altered mental status detailed above.    Additional history discussed with patient's family/caregivers.  Patient placed on continuous vitals and telemetry monitoring while in ED which was reviewed periodically.  Complete initial physical exam performed, notably the patient  was AMS/Hypotensive.     Reviewed and confirmed nursing documentation for past medical history, family history, social history.    Initial Assessment:   With the patient's presentation of altered mental status, most likely diagnosis is delerium 2/2 infectious etiology (UTI/CAP/URI) vs metabolic abnormality (Na/K/Mg/Ca) vs nonspecific etiology. Other diagnoses were considered including (but not limited to) CVA, ICH, intracranial mass, critical dehydration, heptatic dysfunction, uremia, hypercarbia, intoxication, endrocrine abnormality, toxidrome. These are considered less likely due to history of present illness and physical exam findings.   This is most consistent with an acute life/limb threatening illness complicated by underlying chronic conditions.  Initial Plan:  CT Ap to eval for obstructive lesion Screening labs including CBC and Metabolic panel to evaluate for infectious or metabolic etiology of disease.  Urinalysis with reflex culture ordered to evaluate for UTI or relevant urologic/nephrologic pathology.  CXR to evaluate for structural/infectious intrathoracic pathology.  EKG to evaluate for cardiac pathology Objective evaluation as below reviewed   Initial Study Results:   Laboratory  All laboratory results reviewed with leukocytosis,  ***EKG EKG was reviewed independently. Rate, rhythm, axis, intervals all examined and without medically relevant abnormality. ST segments without concerns for elevations.    Radiology:  All images reviewed independently. ***Agree with radiology report at this time.   CT ABDOMEN PELVIS WO CONTRAST Result Date: 06/28/2024 EXAM: CT ABDOMEN AND PELVIS WITHOUT CONTRAST 06/28/2024 01:45:50 AM TECHNIQUE: CT of the abdomen and pelvis was performed without the administration of intravenous contrast. Multiplanar reformatted images are provided for review. Automated exposure control, iterative reconstruction, and/or weight based adjustment of the mA/kV was utilized to reduce the  radiation dose to as low as reasonably achievable. COMPARISON: CT from 08/29/2020. CLINICAL HISTORY: Abdominal pain, acute, nonlocalized. Patient BIB [facility] from home. Family states they were changing patient's brief and noticed dark red blood, first started today. FINDINGS: LOWER CHEST: No acute abnormality. LIVER: The liver is unremarkable. GALLBLADDER AND BILE DUCTS: Gallbladder is unremarkable. No biliary ductal dilatation. SPLEEN: No acute abnormality. PANCREAS: No acute abnormality. ADRENAL GLANDS: No acute abnormality. KIDNEYS, URETERS AND BLADDER: Multiple bilateral renal cystic lesions, some of which are mildly hyperdense. These were compatible with benign cysts on CT from 08/29/2020. The mild hyperdensity may be due to hemorrhagic or proteinaceous transformation. No follow-up recommended. Irregular bladder wall thickening. Correlate with urinalysis for cystitis and consider cystoscopy to evaluate for mass. Evaluation for active bleeding is not possible on noncontrast exam. No hydronephrosis. No perinephric or periureteral stranding. GI AND BOWEL: Stomach demonstrates no acute abnormality. There is no bowel obstruction. No bowel wall thickening. PERITONEUM AND RETROPERITONEUM: No ascites. No free air. VASCULATURE: Aortic atherosclerotic calcification. LYMPH NODES: No lymphadenopathy. REPRODUCTIVE ORGANS: No acute abnormality. BONES AND SOFT TISSUES: No acute osseous abnormality. No focal soft tissue abnormality. IMPRESSION: 1. Irregular bladder wall thickening. Correlate with urinalysis for cystitis and consider cystoscopy to evaluate for mass. Electronically signed by: Norman Gatlin MD 06/28/2024 01:56 AM EDT RP Workstation: [id number]      Consults: Case discussed with ***.   Final Assessment and Plan:   ***  Clinical Impression: No diagnosis found.   Data Unavailable   Final Clinical Impression(s) / ED Diagnoses Final diagnoses:  None    Rx / DC Orders ED Discharge Orders     None

## 2024-06-28 NOTE — Consult Note (Addendum)
 WOC Nurse Consult Note: Reason for Consult: multiple wounds on admission  Wound type:1.   Full thickness R upper back likely abrasion pink  2.  Left hip Stage 2 Pressure Injury (intact serum filled blister)  3.  L knee full thickness ? Etiology dark dry hemorrhagic tissue  4. Unstageable Pressure Injury Sacrum black developing eschar, appears from purple maroon discoloration to edges that this began as a Deep Tissue Pressure Injury  5.  L heel deep tissue Pressure Injury purple maroon discoloration intact skin  6. R lateral and medial foot with deep tissue pressure injuries purple maroon discoloration intact skin  Pressure Injury POA: Yes Measurement:see nursing flowsheet  Wound bed: as above  Drainage (amount, consistency, odor) see nursing flowsheet  Periwound: erythema surrounding sacral wound  Dressing procedure/placement/frequency:  Cleanse sacral wound with Vashe wound cleanser Soila [phone]) do not rinse and allow to air dry.  Apply Vashe moistened gauze to wound bed daily, cover with dry gauze and silicone foam or ABD pad whichever is preferred.  Cleanse R back, L hip L knee wounds with Vashe, apply Xeroform gauze ([place] [phone]) to wound beds every other day and cover with silicone foam.  Cleanse L heel and R foot wounds with Vashe, apply Xeroform gauze Soila [phone]) to wound beds (purple maroon discoloration) every other day  and secure with silicone foam. Place L foot in Prevalon boot Soila [phone]) to offload pressure.   Patient should be placed on a low air loss mattress for pressure redistribution and comfort.   POC discussed with bedside nurse and primary MD. If family decides for aggressive care please reconsult WOC for chemical debridement of sacral wound with Santyl. Would also recommend surgical consult if family decides on aggressive care.  Patient is currently a DNR with palliative following.    WOC team will not follow. Re-consult if further needs arise.   Thank you,     Powell Bar MSN, RN-BC, [employer]

## 2024-06-28 NOTE — Progress Notes (Addendum)
 [facility]                                                                              Marc [name], is a 83 y.o. male, DOB - 1941/11/04, FMW:[id number] Admit date - 06/28/2024    Outpatient Primary MD for the patient is [name], MD  LOS - 0  days  Chief Complaint  Patient presents with   Rectal Bleeding       Brief summary   Patient is 83 year old male with Alzheimer's dementia, PTSD CKD stage IIIa, HLP, HTN, aortic stenosis, prior GI bleed in 2019 required EGD which showed esophagitis and colonoscopy showed hemorrhoids, presented to ED with bright red blood per rectum.  Family reported ongoing diarrhea for almost a month, poor appetite, dehydration, worsening confusion.  In ED, physical exam did not reveal any active GI bleed per rectum. Family had reported that over the last month he has had chronic diarrhea and over the last year he has had recurrent GI bleeds.  Family requested DNR, no invasive interventions like EGD or colonoscopy however for now continue medical therapy.  Assessment & Plan    Principal Problem:  Severe Sepsis (HCC), Acute encephalopathy  - presented with leukocytosis, AMS, lactic acidosis, AKI, hypotension - CT abdomen pelvis showed irregular bladder wall thickening concern for cystitis otherwise no acute intra-abdominal finding. - follow urine culture, blood cultures - started on broad spectrum antibiotics with IV Vanco and cefepime , IVF - follow GI pathogen panel, Cdiff PCR Addendum: 1: 58pm Cdiff negative  GI pathogen panel + norovirus -cont symptomatic treatment, Cr trending up, dc vanc      Altered mental status, superimposed on dementia, PTSD  - likely due to #1 - palliative medicine consulted, likely end stage dementia, overall poor prognosis      Acute kidney injury superimposed CKD stage stage 3b - admitted with Cr 3.27 in the setting chronic diarrhea, GIB, dehydration, poor oral intake - baseline Cr 1.4-1.79.   - Foley, I/O's, Creatinine worsening, 4.27 today with NAG metabolic acidosis, hypernatremia  - GOC meeting today, overall poor prognosis   Acute respiratory failure with hypoxia - likely due to sepsis, possible fluid overload, on 12L HFNC  - overall poor prognosis      Chronic Diarrhea - follow GI and C. difficile panel.  Hypernatremia - likely due to profound dehydration  - changed fluids to D5W at 125cc/hr     GI bleed-likely secondary from hemorrhoid History of GI bleed-secondary to esophagitis and hemorrhoid -Family reported that they have noticed bleeding in the patient's diaper/pants.  Patient has history of internal hemorrhoid underwent EGD and colonoscopy in the past. - hypotensive, H/H stable, continue  IV Protonix  and Anusol  suppository twice daily.     Essential hypertension, aortic stenosis -Due to altered mental status, borderline BP, hold oral medication on hold.   Estimated body mass index is 27.62 kg/m as calculated from the following:   Height as of this encounter: 5' 4 (1.626 m).   Weight as of this encounter: 73 kg.  Code Status: DNR, DNI  DVT Prophylaxis:  SCDs Start: 06/28/24 0232 Place TED hose Start: 06/28/24  0232   Level of Care: Level of care: Progressive Family Communication: GOC meeting   Disposition Plan:      Remains inpatient appropriate:  TBD     Procedures:    Consultants:   Palliative   Antimicrobials:   Anti-infectives (From admission, onward)    Start     Dose/Rate Route Frequency Ordered Stop   06/29/24 0500  ceFEPIme  (MAXIPIME ) 2 g in sodium chloride  0.9 % 100 mL IVPB        2 g 200 mL/hr over 30 Minutes Intravenous Every 24 hours 06/28/24 0243     06/28/24 0242  vancomycin  variable dose per unstable renal function (pharmacist dosing)         Does not apply See admin instructions 06/28/24 0243     06/28/24 0215  ceFEPIme  (MAXIPIME ) 2 g in sodium chloride  0.9 % 100 mL IVPB        2 g 200 mL/hr over 30 Minutes  Intravenous  Once 06/28/24 0205 06/28/24 0301   06/28/24 0215  vancomycin  (VANCOREADY) IVPB 1500 mg/300 mL        1,500 mg 150 mL/hr over 120 Minutes Intravenous  Once 06/28/24 0205 06/28/24 0505          Medications  Chlorhexidine  Gluconate Cloth  6 each Topical Daily   hydrocortisone   1 Application Rectal BID   pantoprazole  (PROTONIX ) IV  40 mg Intravenous Q12H   sodium chloride  flush  3 mL Intravenous Q12H   sodium chloride  flush  3 mL Intravenous Q12H   vancomycin  variable dose per unstable renal function (pharmacist dosing)   Does not apply See admin instructions      Subjective:   Marc [name] was seen and examined today.  Unable to obtain ROS from patient, moaning. No ongoing fevers, N/V/D/C.    Objective:   Vitals:   06/28/24 0715 06/28/24 0728 06/28/24 0745 06/28/24 0900  BP: 116/64  (!) 112/53 (!) 134/99  Pulse: 79  70 74  Resp: 16  15 14   Temp:  97.9 F (36.6 C)  (!) 97.4 F (36.3 C)  TempSrc:  Axillary  Axillary  SpO2: 100%  100%   Weight:      Height:        Intake/Output Summary (Last 24 hours) at 06/28/2024 0942 Last data filed at 06/28/2024 9351 Gross per 24 hour  Intake 3392.86 ml  Output --  Net 3392.86 ml     Wt Readings from Last 3 Encounters:  06/28/24 73 kg  08/29/20 73.5 kg  03/22/20 73.8 kg     Exam General: awake, moaning, appears uncomfortable, ill appearing    Cardiovascular: S1 S2 auscultated,  RRR Respiratory: diminished BS  Gastrointestinal: Soft, nontender, nondistended, + bowel sounds Ext: no pedal edema bilaterally Neuro:  no responding to any commands  Psych: altered, no responding to any commands     Data Reviewed:  I have personally reviewed following labs    CBC Lab Results  Component Value Date   WBC 17.6 (H) 06/28/2024   RBC 3.77 (L) 06/28/2024   HGB 12.3 (L) 06/28/2024   HCT 38.4 (L) 06/28/2024   MCV 101.9 (H) 06/28/2024   MCH 32.6 06/28/2024   PLT 109 (L) 06/28/2024   MCHC 32.0 06/28/2024   RDW  15.5 06/28/2024   LYMPHSABS 0.8 06/28/2024   MONOABS 1.0 06/28/2024   EOSABS 0.0 06/28/2024   BASOSABS 0.0 06/28/2024     Last metabolic panel Lab Results  Component Value Date   NA 151 (H)  06/28/2024   K 4.8 06/28/2024   CL 116 (H) 06/28/2024   CO2 21 (L) 06/28/2024   BUN 99 (H) 06/28/2024   CREATININE 4.27 (H) 06/28/2024   GLUCOSE 131 (H) 06/28/2024   GFRNONAA 13 (L) 06/28/2024   GFRAA 55 (L) 08/29/2020   CALCIUM  7.9 (L) 06/28/2024   PROT 5.8 (L) 06/28/2024   ALBUMIN  2.5 (L) 06/28/2024   LABGLOB 2.4 01/24/2017   AGRATIO 2.0 01/24/2017   BILITOT 2.1 (H) 06/28/2024   ALKPHOS 136 (H) 06/28/2024   AST 102 (H) 06/28/2024   ALT 54 (H) 06/28/2024   ANIONGAP 14 06/28/2024    CBG (last 3)  No results for input(s): GLUCAP in the last 72 hours.    Coagulation Profile: Recent Labs  Lab 06/28/24 0045  INR 1.3*     Radiology Studies: I have personally reviewed the imaging studies  CT ABDOMEN PELVIS WO CONTRAST Result Date: 06/28/2024 EXAM: CT ABDOMEN AND PELVIS WITHOUT CONTRAST 06/28/2024 01:45:50 AM TECHNIQUE: CT of the abdomen and pelvis was performed without the administration of intravenous contrast. Multiplanar reformatted images are provided for review. Automated exposure control, iterative reconstruction, and/or weight based adjustment of the mA/kV was utilized to reduce the radiation dose to as low as reasonably achievable. COMPARISON: CT from 08/29/2020. CLINICAL HISTORY: Abdominal pain, acute, nonlocalized. Patient BIB [facility] from home. Family states they were changing patient's brief and noticed dark red blood, first started today. FINDINGS: LOWER CHEST: No acute abnormality. LIVER: The liver is unremarkable. GALLBLADDER AND BILE DUCTS: Gallbladder is unremarkable. No biliary ductal dilatation. SPLEEN: No acute abnormality. PANCREAS: No acute abnormality. ADRENAL GLANDS: No acute abnormality. KIDNEYS, URETERS AND BLADDER: Multiple bilateral renal cystic lesions, some of  which are mildly hyperdense. These were compatible with benign cysts on CT from 08/29/2020. The mild hyperdensity may be due to hemorrhagic or proteinaceous transformation. No follow-up recommended. Irregular bladder wall thickening. Correlate with urinalysis for cystitis and consider cystoscopy to evaluate for mass. Evaluation for active bleeding is not possible on noncontrast exam. No hydronephrosis. No perinephric or periureteral stranding. GI AND BOWEL: Stomach demonstrates no acute abnormality. There is no bowel obstruction. No bowel wall thickening. PERITONEUM AND RETROPERITONEUM: No ascites. No free air. VASCULATURE: Aortic atherosclerotic calcification. LYMPH NODES: No lymphadenopathy. REPRODUCTIVE ORGANS: No acute abnormality. BONES AND SOFT TISSUES: No acute osseous abnormality. No focal soft tissue abnormality. IMPRESSION: 1. Irregular bladder wall thickening. Correlate with urinalysis for cystitis and consider cystoscopy to evaluate for mass. Electronically signed by: Norman Gatlin MD 06/28/2024 01:56 AM EDT RP Workstation: [id number]       Nydia Distance M.D. [facility] 06/28/2024, 9:42 AM  Available via Epic secure chat 7am-7pm After 7 pm, please refer to night coverage provider listed on amion.

## 2024-06-28 NOTE — ED Notes (Signed)
 Attempted to straight cath patient, meeting resistance and no urine return. Provider aware, condom cath applied with intention of obtaining urine sample.

## 2024-06-28 NOTE — ED Triage Notes (Signed)
 Patient BIB [facility] from home. Family states they were changing patient's brief and noticed dark red blood, first started today. Upon arrival patient hypotensive 80's systolic. After 700 NS bp 135/75. Hx of dementia. Hx of GI bleed, no thinners.

## 2024-06-28 NOTE — Evaluation (Signed)
 Clinical/Bedside Swallow Evaluation Patient Details  Name: Marc [name] MRN: [id number] Date of Birth: [birth year]  Today's Date: 06/28/2024 Time: SLP Start Time (ACUTE ONLY): 1007 SLP Stop Time (ACUTE ONLY): 1015 SLP Time Calculation (min) (ACUTE ONLY): 8 min  Past Medical History:  Past Medical History:  Diagnosis Date   BPH (benign prostatic hypertrophy)    WITH MICROSCOPIC HEMATURIA   Hematuria    Hyperlipidemia    Hypertension    has severe LVH per echo in November 2012   Past Surgical History:  Past Surgical History:  Procedure Laterality Date   COLONOSCOPY WITH PROPOFOL  N/A 01/09/2018   Procedure: COLONOSCOPY WITH PROPOFOL ;  Surgeon: Elicia Claw, MD;  Location: [facility];  Service: Gastroenterology;  Laterality: N/A;   ESOPHAGOGASTRODUODENOSCOPY (EGD) WITH PROPOFOL  N/A 01/08/2018   Procedure: ESOPHAGOGASTRODUODENOSCOPY (EGD) WITH PROPOFOL ;  Surgeon: Elicia Claw, MD;  Location: [facility];  Service: Gastroenterology;  Laterality: N/A;   HPI:  Tayt Moyers is an 83 yo male with PMH of PTSD, Alzheimer's dementia, CKD 3a, essential HTN, aortic stenosis, prior GI bleed presenting to ED 8/2 with concern with GI bleed.    Assessment / Plan / Recommendation  Clinical Impression  Pt's son, who was present throughout the session, states pt was eating a regular diet up until 2 weeks ago when increased difficulty was noted. Pt is lethargic and inattentive, unable to follow commands. Swallowing ice chips and small sips of water appears effortful and incoordinated, resulting in immediate huff-like coughing. There is limited awareness of the bolus itself or of anterior loss. Pt is not currently demonstrating readiness to initiate a PO diet. Recommend NPO status be maintained with the exception of ice chips after thorough oral care. SLP will continue following. SLP Visit Diagnosis: Dysphagia, unspecified (R13.10)    Aspiration Risk  Mild aspiration risk    Diet Recommendation  NPO;Ice chips PRN after oral care    Medication Administration: Via alternative means    Other  Recommendations Oral Care Recommendations: Oral care QID;Oral care prior to ice chip/H20     Assistance Recommended at Discharge    Functional Status Assessment Patient has had a recent decline in their functional status and demonstrates the ability to make significant improvements in function in a reasonable and predictable amount of time.  Frequency and Duration min 2x/week  2 weeks       Prognosis Prognosis for improved oropharyngeal function: Fair Barriers to Reach Goals: Cognitive deficits      Swallow Study   General HPI: Joaovictor Krone is an 83 yo male with PMH of PTSD, Alzheimer's dementia, CKD 3a, essential HTN, aortic stenosis, prior GI bleed presenting to ED 8/2 with concern with GI bleed. Type of Study: Bedside Swallow Evaluation Previous Swallow Assessment: none in chart Diet Prior to this Study: NPO Temperature Spikes Noted: No Respiratory Status: Room air History of Recent Intubation: No Behavior/Cognition: Lethargic/Drowsy;Distractible;Requires cueing Oral Cavity Assessment: Within Functional Limits Oral Care Completed by SLP: No Oral Cavity - Dentition: Missing dentition;Poor condition Vision: Functional for self-feeding Self-Feeding Abilities: Total assist Patient Positioning: Upright in bed Baseline Vocal Quality: Not observed Volitional Cough: Cognitively unable to elicit Volitional Swallow: Unable to elicit    Oral/Motor/Sensory Function Overall Oral Motor/Sensory Function: Within functional limits   Ice Chips Ice chips: Impaired Presentation: Spoon Oral Phase Impairments: Reduced labial seal;Reduced lingual movement/coordination;Poor awareness of bolus Pharyngeal Phase Impairments: Multiple swallows   Thin Liquid Thin Liquid: Impaired Presentation: Spoon;Cup Oral Phase Impairments: Reduced labial seal;Reduced lingual movement/coordination;Poor awareness of  bolus  Oral Phase Functional Implications: Right anterior spillage;Left anterior spillage Pharyngeal  Phase Impairments: Suspected delayed Swallow;Multiple swallows;Wet Vocal Quality;Cough - Immediate    Nectar Thick Nectar Thick Liquid: Not tested   Honey Thick Honey Thick Liquid: Not tested   Puree Puree: Not tested   Solid     Solid: Not tested      Damien Blumenthal, M.A., CCC-SLP Speech Language Pathology, Acute Rehabilitation Services  Secure Chat preferred [phone]  06/28/2024,11:31 AM

## 2024-06-29 DIAGNOSIS — Z7189 Other specified counseling: Secondary | ICD-10-CM | POA: Diagnosis not present

## 2024-06-29 DIAGNOSIS — Z711 Person with feared health complaint in whom no diagnosis is made: Secondary | ICD-10-CM

## 2024-06-29 DIAGNOSIS — N179 Acute kidney failure, unspecified: Secondary | ICD-10-CM | POA: Diagnosis not present

## 2024-06-29 DIAGNOSIS — G309 Alzheimer's disease, unspecified: Secondary | ICD-10-CM | POA: Diagnosis not present

## 2024-06-29 DIAGNOSIS — Z515 Encounter for palliative care: Secondary | ICD-10-CM | POA: Diagnosis not present

## 2024-06-29 DIAGNOSIS — R400 Somnolence: Secondary | ICD-10-CM | POA: Diagnosis not present

## 2024-06-29 DIAGNOSIS — Z8719 Personal history of other diseases of the digestive system: Secondary | ICD-10-CM | POA: Diagnosis not present

## 2024-06-29 DIAGNOSIS — A0811 Acute gastroenteropathy due to Norwalk agent: Secondary | ICD-10-CM | POA: Diagnosis present

## 2024-06-29 DIAGNOSIS — L8990 Pressure ulcer of unspecified site, unspecified stage: Secondary | ICD-10-CM | POA: Diagnosis present

## 2024-06-29 LAB — MAGNESIUM: Magnesium: 2.1 mg/dL (ref 1.7–2.4)

## 2024-06-29 LAB — COMPREHENSIVE METABOLIC PANEL WITH GFR
ALT: 37 U/L (ref 0–44)
AST: 44 U/L — ABNORMAL HIGH (ref 15–41)
Albumin: 1.8 g/dL — ABNORMAL LOW (ref 3.5–5.0)
Alkaline Phosphatase: 94 U/L (ref 38–126)
Anion gap: 9 (ref 5–15)
BUN: 64 mg/dL — ABNORMAL HIGH (ref 8–23)
CO2: 24 mmol/L (ref 22–32)
Calcium: 7.4 mg/dL — ABNORMAL LOW (ref 8.9–10.3)
Chloride: 110 mmol/L (ref 98–111)
Creatinine, Ser: 2.79 mg/dL — ABNORMAL HIGH (ref 0.61–1.24)
GFR, Estimated: 22 mL/min — ABNORMAL LOW (ref >60)
Glucose, Bld: 158 mg/dL — ABNORMAL HIGH (ref 70–99)
Potassium: 2.9 mmol/L — ABNORMAL LOW (ref 3.5–5.1)
Sodium: 143 mmol/L (ref 135–145)
Total Bilirubin: 0.8 mg/dL (ref 0.0–1.2)
Total Protein: 4.8 g/dL — ABNORMAL LOW (ref 6.5–8.1)

## 2024-06-29 LAB — URINE CULTURE: Culture: <10000 — AB

## 2024-06-29 LAB — CBC
HCT: 34.9 % — ABNORMAL LOW (ref 39.0–52.0)
Hemoglobin: 11.5 g/dL — ABNORMAL LOW (ref 13.0–17.0)
MCH: 33 pg (ref 26.0–34.0)
MCHC: 33 g/dL (ref 30.0–36.0)
MCV: 100 fL (ref 80.0–100.0)
Platelets: 106 K/uL — ABNORMAL LOW (ref 150–400)
RBC: 3.49 MIL/uL — ABNORMAL LOW (ref 4.22–5.81)
RDW: 15.1 % (ref 11.5–15.5)
WBC: 16.5 K/uL — ABNORMAL HIGH (ref 4.0–10.5)
nRBC: 0 % (ref 0.0–0.2)

## 2024-06-29 LAB — CORTISOL: Cortisol, Plasma: 31 ug/dL

## 2024-06-29 LAB — PHOSPHORUS: Phosphorus: 3.3 mg/dL (ref 2.5–4.6)

## 2024-06-29 MED ORDER — DEXTROSE 5 % IV SOLN: INTRAVENOUS | Status: DC

## 2024-06-29 MED ORDER — POTASSIUM CHLORIDE 10 MEQ/100ML IV SOLN
10.0000 meq | INTRAVENOUS | Status: AC
  Administered 2024-06-29 (×5): 10 meq via INTRAVENOUS
  Filled 2024-06-29: qty 100

## 2024-06-29 MED ORDER — MIDODRINE HCL 5 MG PO TABS
5.0000 mg | ORAL_TABLET | Freq: Three times a day (TID) | ORAL | Status: DC
  Filled 2024-06-29: qty 1

## 2024-06-29 MED ORDER — ALBUMIN HUMAN 25 % IV SOLN
25.0000 g | Freq: Three times a day (TID) | INTRAVENOUS | Status: AC
  Administered 2024-06-29: 25 g via INTRAVENOUS
  Administered 2024-06-29: 12.5 g via INTRAVENOUS
  Administered 2024-06-30: 25 g via INTRAVENOUS
  Filled 2024-06-29 (×3): qty 100

## 2024-06-29 MED ORDER — POTASSIUM CHLORIDE 10 MEQ/100ML IV SOLN
INTRAVENOUS | Status: AC
Start: 2024-06-29 — End: 2024-06-29
  Filled 2024-06-29: qty 100

## 2024-06-29 NOTE — Plan of Care (Signed)
  Problem: Clinical Measurements: Goal: Respiratory complications will improve Outcome: Progressing Goal: Cardiovascular complication will be avoided Outcome: Progressing   Problem: Education: Goal: Knowledge of General Education information will improve Description: Including pain rating scale, medication(s)/side effects and non-pharmacologic comfort measures Outcome: Not Progressing   Problem: Health Behavior/Discharge Planning: Goal: Ability to manage health-related needs will improve Outcome: Not Progressing   Problem: Nutrition: Goal: Adequate nutrition will be maintained Outcome: Not Progressing

## 2024-06-29 NOTE — Plan of Care (Signed)

## 2024-06-29 NOTE — Progress Notes (Addendum)
 [facility]                                                                              Marc [name], is a 83 y.o. male, DOB - [birth year], FMW:[id number] Admit date - 06/28/2024    Outpatient Primary MD for the patient is Marc Madelin Patch, MD  LOS - 1  days  Chief Complaint  Patient presents with   Rectal Bleeding       Brief summary   Patient is 83 year old male with Alzheimer's dementia, PTSD CKD stage IIIa, HLP, HTN, aortic stenosis, prior GI bleed in 2019 required EGD which showed esophagitis and colonoscopy showed hemorrhoids, presented to ED with bright red blood per rectum.  Family reported ongoing diarrhea for almost a month, poor appetite, dehydration, worsening confusion.  In ED, physical exam did not reveal any active GI bleed per rectum. Family had reported that over the last month he has had chronic diarrhea and over the last year he has had recurrent GI bleeds.  Family requested DNR, no invasive interventions like EGD or colonoscopy however for now continue medical therapy.  Assessment & Plan    Severe Sepsis (HCC), Proteus bacteremia, likely from UTI Norovirus diarrhea  hypotensive - presented with leukocytosis, AMS, lactic acidosis, AKI, hypotension - CT abdomen pelvis showed irregular bladder wall thickening concern for cystitis otherwise no acute intra-abdominal finding. - Blood cultures positive for Proteus mirabilis, GI pathogen panel positive for norovirus - Antibiotics narrowed to IV Rocephin  - BP still soft, unable to give midodrine  due to n.p.o. status, will place on IV albumin .  Alert and awake, asymptomatic.  Will check cortisol level.    Acute metabolic encephalopathy, superimposed on Alzheimer's dementia, PTSD  - likely due to #1 - palliative medicine consulted, likely end stage dementia, overall poor prognosis   - Opening eyes today, responds to voice, however no verbal conversation, likely has end-stage dementia.  Per son at the  bedside he has not talked in a while.  Palliative medicine following.    Acute kidney injury superimposed CKD stage stage 3b - admitted with Cr 3.27 in the setting chronic diarrhea, GIB, dehydration, poor oral intake, severe sepsis - baseline Cr 1.4-1.79.,  Plateaued at 4.27 - Improving with IV fluids, creatinine 2.79 today  Acute respiratory failure with hypoxia - likely due to sepsis, possible fluid overload, was placed on 12L HFNC in ED - overall poor prognosis  - Improving, currently sats 100% on room air  Norovirus diarrhea - C. difficile negative - Symptomatic treatment, IVF   Hypernatremia - likely due to profound dehydration  - Improving, continue D5 at 75 cc an hour  Hypokalemia - Replaced IV  Transaminitis - Due to severe sepsis, improving    GI bleed-likely secondary from hemorrhoid History of GI bleed-secondary to esophagitis and hemorrhoid -Family reported that they have noticed bleeding in the patient's diaper/pants.  Patient has history of internal hemorrhoid underwent EGD and colonoscopy in the past. - continue  IV Protonix  and Anusol  suppository twice daily. -Family declined any invasive interventions     Essential hypertension, aortic stenosis - Hypotensive, hold oral antihypertensives, placed on midodrine   Multiple  wounds, POA Wound 06/28/24 1105 Pressure Injury Heel Right Deep Tissue Pressure Injury - Purple or maroon localized area of discolored intact skin or blood-filled blister due to damage of underlying soft tissue from pressure and/or shear. (Active)  Date First Assessed/Time First Assessed: 06/28/24 1105   Present on Original Admission: Yes  Primary Wound Type: Pressure Injury  Location: Heel  Location Orientation: Right  Staging: Deep Tissue Pressure Injury - Purple or maroon localized area of di...    Assessments 06/28/2024 11:00 AM 06/28/2024  8:00 PM  Wound Image      Dressing Type Foam - Lift dressing to assess site every shift Foam - Lift  dressing to assess site every shift     No associated orders.     Wound 06/28/24 1106 Pressure Injury Coccyx Mid Unstageable - Full thickness tissue loss in which the base of the injury is covered by slough (yellow, tan, gray, green or brown) and/or eschar (tan, brown or black) in the wound bed. (Active)  Date First Assessed/Time First Assessed: 06/28/24 1106   Present on Original Admission: Yes  Primary Wound Type: Pressure Injury  Location: Coccyx  Location Orientation: Mid  Staging: Unstageable - Full thickness tissue loss in which the base of the i...    Assessments 06/28/2024 11:00 AM 06/28/2024  8:00 PM  Wound Image     Dressing Type Foam - Lift dressing to assess site every shift Foam - Lift dressing to assess site every shift     No associated orders.     Wound 06/28/24 1107 Pressure Injury Ankle Anterior;Left;Posterior Deep Tissue Pressure Injury - Purple or maroon localized area of discolored intact skin or blood-filled blister due to damage of underlying soft tissue from pressure and/or shear. (Active)  Date First Assessed/Time First Assessed: 06/28/24 1107   Present on Original Admission: Yes  Primary Wound Type: Pressure Injury  Location: Ankle  Location Orientation: Anterior;Left;Posterior  Staging: Deep Tissue Pressure Injury - Purple or maroon l...    Assessments 06/28/2024 11:00 AM 06/28/2024  8:00 PM  Wound Image     Dressing Type Foam - Lift dressing to assess site every shift Foam - Lift dressing to assess site every shift     No associated orders.     Wound 06/28/24 1108 Pressure Injury Thigh Anterior;Left;Proximal Stage 1 -  Intact skin with non-blanchable redness of a localized area usually over a bony prominence. (Active)  Date First Assessed/Time First Assessed: 06/28/24 1108   Present on Original Admission: Yes  Primary Wound Type: Pressure Injury  Location: Thigh  Location Orientation: Anterior;Left;Proximal  Staging: Stage 1 -  Intact skin with non-blanchable rednes...     Assessments 06/28/2024 11:00 AM 06/28/2024  8:00 PM  Wound Image     Dressing Type Foam - Lift dressing to assess site every shift Foam - Lift dressing to assess site every shift     No associated orders.     Wound 06/28/24 1108 Pressure Injury Back Right Stage 1 -  Intact skin with non-blanchable redness of a localized area usually over a bony prominence. (Active)  Date First Assessed/Time First Assessed: 06/28/24 1108   Present on Original Admission: Yes  Primary Wound Type: Pressure Injury  Location: Back  Location Orientation: Right  Staging: Stage 1 -  Intact skin with non-blanchable redness of a localized a...    Assessments 06/28/2024 11:00 AM 06/28/2024  8:00 PM  Wound Image     Dressing Type Foam - Lift dressing to assess site every  shift Foam - Lift dressing to assess site every shift     No associated orders.     Wound 06/28/24 1110 Pressure Injury Knee Anterior;Left Stage 1 -  Intact skin with non-blanchable redness of a localized area usually over a bony prominence. (Active)  Date First Assessed/Time First Assessed: 06/28/24 1110   Present on Original Admission: Yes  Primary Wound Type: Pressure Injury  Location: Knee  Location Orientation: Anterior;Left  Staging: Stage 1 -  Intact skin with non-blanchable redness of a loc...    Assessments 06/28/2024 11:00 AM 06/28/2024  8:00 PM  Wound Image     Dressing Type Foam - Lift dressing to assess site every shift Foam - Lift dressing to assess site every shift     No associated orders.     Estimated body mass index is 27.62 kg/m as calculated from the following:   Height as of this encounter: 5' 4 (1.626 m).   Weight as of this encounter: 73 kg.  Code Status: DNR, DNI  DVT Prophylaxis:  SCDs Start: 06/28/24 0232 Place TED hose Start: 06/28/24 0232   Level of Care: Level of care: Progressive Family Communication: Discussed at length with patient's son at the bedside Disposition Plan:      Remains inpatient appropriate: Hospice when  medically stable to discharge.  Overall poor prognosis   Procedures:    Consultants:   Palliative   Antimicrobials:   Anti-infectives (From admission, onward)    Start     Dose/Rate Route Frequency Ordered Stop   06/29/24 0500  ceFEPIme  (MAXIPIME ) 2 g in sodium chloride  0.9 % 100 mL IVPB  Status:  Discontinued        2 g 200 mL/hr over 30 Minutes Intravenous Every 24 hours 06/28/24 0243 06/28/24 1804   06/28/24 2200  cefTRIAXone  (ROCEPHIN ) 2 g in sodium chloride  0.9 % 100 mL IVPB        2 g 200 mL/hr over 30 Minutes Intravenous Every 24 hours 06/28/24 1804     06/28/24 0242  vancomycin  variable dose per unstable renal function (pharmacist dosing)  Status:  Discontinued         Does not apply See admin instructions 06/28/24 0243 06/28/24 1357   06/28/24 0215  ceFEPIme  (MAXIPIME ) 2 g in sodium chloride  0.9 % 100 mL IVPB        2 g 200 mL/hr over 30 Minutes Intravenous  Once 06/28/24 0205 06/28/24 0301   06/28/24 0215  vancomycin  (VANCOREADY) IVPB 1500 mg/300 mL        1,500 mg 150 mL/hr over 120 Minutes Intravenous  Once 06/28/24 0205 06/28/24 0505          Medications  Chlorhexidine  Gluconate Cloth  6 each Topical Daily   hydrocortisone   1 Application Rectal BID   pantoprazole  (PROTONIX ) IV  40 mg Intravenous Q12H   sodium chloride  flush  3 mL Intravenous Q12H   sodium chloride  flush  3 mL Intravenous Q12H      Subjective:   Marc [name] was seen and examined today.  Alert and awake today, opens eyes to verbal commands however no conversation or following any commands.  Son at the bedside, feels he is improving.  He also states that patient has not talked in a while.   Objective:   Vitals:   06/29/24 0300 06/29/24 0400 06/29/24 0500 06/29/24 0943  BP: (!) 77/39 (!) 83/43 (!) 94/44 (!) 92/42  Pulse: 76 75 69 71  Resp: 17 19 14 18   Temp:  98.8 F (37.1 C)   98.1 F (36.7 C)  TempSrc: Oral   Oral  SpO2: 100% 95% 100% 100%  Weight:      Height:         Intake/Output Summary (Last 24 hours) at 06/29/2024 1025 Last data filed at 06/28/2024 2255 Gross per 24 hour  Intake 1972.76 ml  Output 750 ml  Net 1222.76 ml     Wt Readings from Last 3 Encounters:  06/28/24 73 kg  08/29/20 73.5 kg  03/22/20 73.8 kg   Physical Exam General: Somnolent but arousable and alerts to voice, does not follow any commands  Cardiovascular: S1 S2 clear, RRR.  Respiratory: Diminished breath sounds at the bases Gastrointestinal: Soft, nontender, nondistended, NBS Ext: no pedal edema bilaterally Neuro: Does not follow any commands  Psych: Advanced dementia    Data Reviewed:  I have personally reviewed following labs    CBC Lab Results  Component Value Date   WBC 16.5 (H) 06/29/2024   RBC 3.49 (L) 06/29/2024   HGB 11.5 (L) 06/29/2024   HCT 34.9 (L) 06/29/2024   MCV 100.0 06/29/2024   MCH 33.0 06/29/2024   PLT 106 (L) 06/29/2024   MCHC 33.0 06/29/2024   RDW 15.1 06/29/2024   LYMPHSABS 0.8 06/28/2024   MONOABS 1.0 06/28/2024   EOSABS 0.0 06/28/2024   BASOSABS 0.0 06/28/2024     Last metabolic panel Lab Results  Component Value Date   NA 143 06/29/2024   K 2.9 (L) 06/29/2024   CL 110 06/29/2024   CO2 24 06/29/2024   BUN 64 (H) 06/29/2024   CREATININE 2.79 (H) 06/29/2024   GLUCOSE 158 (H) 06/29/2024   GFRNONAA 22 (L) 06/29/2024   GFRAA 55 (L) 08/29/2020   CALCIUM  7.4 (L) 06/29/2024   PHOS 3.3 06/29/2024   PROT 4.8 (L) 06/29/2024   ALBUMIN  1.8 (L) 06/29/2024   LABGLOB 2.4 01/24/2017   AGRATIO 2.0 01/24/2017   BILITOT 0.8 06/29/2024   ALKPHOS 94 06/29/2024   AST 44 (H) 06/29/2024   ALT 37 06/29/2024   ANIONGAP 9 06/29/2024    CBG (last 3)  No results for input(s): GLUCAP in the last 72 hours.    Coagulation Profile: Recent Labs  Lab 06/28/24 0045  INR 1.3*     Radiology Studies: I have personally reviewed the imaging studies  CT ABDOMEN PELVIS WO CONTRAST Result Date: 06/28/2024 EXAM: CT ABDOMEN AND PELVIS WITHOUT  CONTRAST 06/28/2024 01:45:50 AM TECHNIQUE: CT of the abdomen and pelvis was performed without the administration of intravenous contrast. Multiplanar reformatted images are provided for review. Automated exposure control, iterative reconstruction, and/or weight based adjustment of the mA/kV was utilized to reduce the radiation dose to as low as reasonably achievable. COMPARISON: CT from 08/29/2020. CLINICAL HISTORY: Abdominal pain, acute, nonlocalized. Patient BIB [facility] from home. Family states they were changing patient's brief and noticed dark red blood, first started today. FINDINGS: LOWER CHEST: No acute abnormality. LIVER: The liver is unremarkable. GALLBLADDER AND BILE DUCTS: Gallbladder is unremarkable. No biliary ductal dilatation. SPLEEN: No acute abnormality. PANCREAS: No acute abnormality. ADRENAL GLANDS: No acute abnormality. KIDNEYS, URETERS AND BLADDER: Multiple bilateral renal cystic lesions, some of which are mildly hyperdense. These were compatible with benign cysts on CT from 08/29/2020. The mild hyperdensity may be due to hemorrhagic or proteinaceous transformation. No follow-up recommended. Irregular bladder wall thickening. Correlate with urinalysis for cystitis and consider cystoscopy to evaluate for mass. Evaluation for active bleeding is not possible on noncontrast exam. No hydronephrosis. No  perinephric or periureteral stranding. GI AND BOWEL: Stomach demonstrates no acute abnormality. There is no bowel obstruction. No bowel wall thickening. PERITONEUM AND RETROPERITONEUM: No ascites. No free air. VASCULATURE: Aortic atherosclerotic calcification. LYMPH NODES: No lymphadenopathy. REPRODUCTIVE ORGANS: No acute abnormality. BONES AND SOFT TISSUES: No acute osseous abnormality. No focal soft tissue abnormality. IMPRESSION: 1. Irregular bladder wall thickening. Correlate with urinalysis for cystitis and consider cystoscopy to evaluate for mass. Electronically signed by: Norman Gatlin MD  06/28/2024 01:56 AM EDT RP Workstation: [id number]       Nydia Distance M.D. [facility] 06/29/2024, 10:25 AM  Available via Epic secure chat 7am-7pm After 7 pm, please refer to night coverage provider listed on amion.

## 2024-06-29 NOTE — Progress Notes (Signed)
 Palliative Medicine Inpatient Follow Up Note HPI: Marc [name] is a 83 y.o. male with medical history significant of PTSD, dementia, CKD stage IIIa, hyperlipidemia, Alzheimer disease, essential hypertension, aortic stenosis, ventricular hypertrophy, prior GI bleed 2019 required blood transfusion underwent EGD showing esophagitis and colonoscopy showed hemorrhoid. She is here for ongoing BRBPR, adult FTT, and volume depletion. Palliative care has been asked to support goals of care conversation(s).   Today's Discussion 06/29/2024  *Please note that this is a verbal dictation therefore any spelling or grammatical errors are due to the Dragon Medical One system interpretation.  Chart reviewed inclusive of vital signs, progress notes, laboratory results, and diagnostic images.   I met with Marc [name] at bedside this morning in the company of his son, Marc [name]. He is able to open his eyes and overall appears more interactive. He is not in distress and does seem to recognize his son.  Dr. Davia is present and shares that from a labs perspective, Marc [name] has improved with hydration and antibiotics. He has been identified to have both norovirus and a proteus mirabilis UTI which are being treated now.   I spoke with Marc [name] about the big picture and the concern(s) in the setting of patients worsening dementia and long term outcomes associated with this. We discussed that it is reassuring that Marc [name] is going to likely get through this illness though it is very likely he will be afflicted by another illness in the near future. We reviewed when that happens that Marc [name] does not want him to come back to the hospital. He shares that this is distressing to the patient and not improving his overall quality of life. We reviewed the idea of home hospice after his infection is treated and when he should worsen again only focusing on symptom management and allowing him to have a peaceful passing.   Reflection on the strength of Marc [name] was  had. We reviewed how strong of a man he is considering the horrific circumstances he has lived through.  The plan at this time will be to treat active sources of infection and for Marc [name] who has end stage dementia to transition home with Marc [name] hospice.   Questions and concerns addressed/Palliative Support Provided.   Objective Assessment: Vital Signs Vitals:   06/29/24 0400 06/29/24 0500  BP: (!) 83/43 (!) 94/44  Pulse: 75 69  Resp: 19 14  Temp:    SpO2: 95% 100%    Intake/Output Summary (Last 24 hours) at 06/29/2024 0930 Last data filed at 06/28/2024 2255 Gross per 24 hour  Intake 1972.76 ml  Output 750 ml  Net 1222.76 ml   Last Weight  Most recent update: 06/28/2024 12:12 AM    Weight  73 kg (160 lb 15 oz)            Gen: Marc [name] male chronically ill in appearance HEENT: Dry mucous membranes CV: Regular rate and rhythm  PULM: On RA, breathing is nonlabored ABD: soft/nontender  EXT: No edema  Neuro: Oriented to self  SUMMARY OF RECOMMENDATIONS   DNAR/DNI  GOLD DNR completed   Continue with present treatments  Plan for Discharge home with hospice once medically optimized   Ongoing palliative care support ______________________________________________________________________________________ Marc [name] Marc [name] Palliative Medicine Team Team Cell Phone: [phone] Please utilize secure chat with additional questions, if there is no response within 30 minutes please call the above phone number  Time Spent: 60 Billing based on MDM: High  Palliative Medicine Team providers are available by phone  from 7am to 7pm daily and can be reached through the team cell phone.  Should this patient require assistance outside of these hours, please call the patient's attending physician.

## 2024-06-29 NOTE — Progress Notes (Signed)
 SLP Cancellation Note  Patient Details Name: Reyaan Thoma MRN: [id number] DOB: 02-May-1941   Cancelled treatment:       Reason Eval/Treat Not Completed: Fatigue/lethargy limiting ability to participate. Pt opened his eyes briefly in response to noxious stimuli but did not sustain alertness to initiate acceptance of POs. Note family's preference for pt to return home with hospice so SLP will f/u with PMT and pt's family to confirm goals as they relate to oral intake.    Damien Blumenthal, M.A., CCC-SLP Speech Language Pathology, Acute Rehabilitation Services  Secure Chat preferred [phone]  06/29/2024, 12:10 PM

## 2024-06-29 NOTE — Progress Notes (Signed)
 Placed IV team consult for PIV access.

## 2024-06-30 DIAGNOSIS — N179 Acute kidney failure, unspecified: Secondary | ICD-10-CM | POA: Diagnosis not present

## 2024-06-30 DIAGNOSIS — G309 Alzheimer's disease, unspecified: Secondary | ICD-10-CM | POA: Diagnosis not present

## 2024-06-30 DIAGNOSIS — Z7189 Other specified counseling: Secondary | ICD-10-CM | POA: Diagnosis not present

## 2024-06-30 DIAGNOSIS — N189 Chronic kidney disease, unspecified: Secondary | ICD-10-CM | POA: Diagnosis not present

## 2024-06-30 DIAGNOSIS — Z515 Encounter for palliative care: Secondary | ICD-10-CM | POA: Diagnosis not present

## 2024-06-30 DIAGNOSIS — R400 Somnolence: Secondary | ICD-10-CM | POA: Diagnosis not present

## 2024-06-30 LAB — CULTURE, BLOOD (ROUTINE X 2)

## 2024-06-30 LAB — CBC
HCT: 34.3 % — ABNORMAL LOW (ref 39.0–52.0)
Hemoglobin: 11.5 g/dL — ABNORMAL LOW (ref 13.0–17.0)
MCH: 33 pg (ref 26.0–34.0)
MCHC: 33.5 g/dL (ref 30.0–36.0)
MCV: 98.6 fL (ref 80.0–100.0)
Platelets: 80 K/uL — ABNORMAL LOW (ref 150–400)
RBC: 3.48 MIL/uL — ABNORMAL LOW (ref 4.22–5.81)
RDW: 14.6 % (ref 11.5–15.5)
WBC: 16.4 K/uL — ABNORMAL HIGH (ref 4.0–10.5)
nRBC: 0 % (ref 0.0–0.2)

## 2024-06-30 LAB — COMPREHENSIVE METABOLIC PANEL WITH GFR
ALT: 39 U/L (ref 0–44)
AST: 48 U/L — ABNORMAL HIGH (ref 15–41)
Albumin: 2.7 g/dL — ABNORMAL LOW (ref 3.5–5.0)
Alkaline Phosphatase: 99 U/L (ref 38–126)
Anion gap: 9 (ref 5–15)
BUN: 40 mg/dL — ABNORMAL HIGH (ref 8–23)
CO2: 22 mmol/L (ref 22–32)
Calcium: 7.6 mg/dL — ABNORMAL LOW (ref 8.9–10.3)
Chloride: 107 mmol/L (ref 98–111)
Creatinine, Ser: 1.74 mg/dL — ABNORMAL HIGH (ref 0.61–1.24)
GFR, Estimated: 39 mL/min — ABNORMAL LOW (ref >60)
Glucose, Bld: 115 mg/dL — ABNORMAL HIGH (ref 70–99)
Potassium: 3.5 mmol/L (ref 3.5–5.1)
Sodium: 138 mmol/L (ref 135–145)
Total Bilirubin: 1.1 mg/dL (ref 0.0–1.2)
Total Protein: 5.5 g/dL — ABNORMAL LOW (ref 6.5–8.1)

## 2024-06-30 LAB — MAGNESIUM: Magnesium: 2 mg/dL (ref 1.7–2.4)

## 2024-06-30 LAB — PHOSPHORUS: Phosphorus: 1.8 mg/dL — ABNORMAL LOW (ref 2.5–4.6)

## 2024-06-30 MED ORDER — DEXTROSE 5 % IV SOLN: INTRAVENOUS | Status: AC

## 2024-06-30 MED ORDER — MIDODRINE HCL 5 MG PO TABS: 5.0000 mg | ORAL_TABLET | Freq: Three times a day (TID) | ORAL | Status: DC

## 2024-06-30 MED ORDER — SODIUM CHLORIDE 0.9% FLUSH: 10.0000 mL | INTRAVENOUS | Status: DC | PRN

## 2024-06-30 MED ORDER — POTASSIUM PHOSPHATES 15 MMOLE/5ML IV SOLN
30.0000 mmol | Freq: Once | INTRAVENOUS | Status: AC
  Administered 2024-06-30: 30 mmol via INTRAVENOUS
  Filled 2024-06-30: qty 10

## 2024-06-30 MED ORDER — CEFAZOLIN SODIUM-DEXTROSE 2-4 GM/100ML-% IV SOLN
2.0000 g | Freq: Three times a day (TID) | INTRAVENOUS | Status: DC
  Administered 2024-06-30 – 2024-07-02 (×5): 2 g via INTRAVENOUS
  Filled 2024-06-30 (×5): qty 100

## 2024-06-30 MED ORDER — ENSURE PLUS HIGH PROTEIN PO LIQD
237.0000 mL | Freq: Two times a day (BID) | ORAL | Status: DC
  Administered 2024-07-01 – 2024-07-02 (×2): 237 mL via ORAL

## 2024-06-30 MED ORDER — SODIUM CHLORIDE 0.9 % IV BOLUS
1000.0000 mL | Freq: Once | INTRAVENOUS | Status: AC
  Administered 2024-06-30: 1000 mL via INTRAVENOUS

## 2024-06-30 MED ORDER — SODIUM CHLORIDE 0.9% FLUSH
10.0000 mL | Freq: Two times a day (BID) | INTRAVENOUS | Status: DC
  Administered 2024-06-30 – 2024-07-02 (×3): 10 mL

## 2024-06-30 MED ORDER — MIDODRINE HCL 5 MG PO TABS
10.0000 mg | ORAL_TABLET | Freq: Three times a day (TID) | ORAL | Status: DC
  Administered 2024-07-01 – 2024-07-02 (×5): 10 mg via ORAL
  Filled 2024-06-30 (×6): qty 2

## 2024-06-30 NOTE — TOC Initial Note (Signed)
 Transition of Care (TOC) - Initial/Assessment Note  Rayfield Gobble RN, BSN Inpatient Care Management Unit 4E- RN Case Manager See Treatment Team for direct phone #   Patient Details  Name: Marc [name] MRN: [id number] Date of Birth: 10/17/1941  Transition of Care [facility]) CM/SW Contact:    Gobble Rayfield Hurst, RN Phone Number: 06/30/2024, 3:19 PM  Clinical Narrative:                 Received msg from [facility]- family would like to treat infection here (pt currently on IV abx) with plan to return home under Hospice. Per notes Referral has already been sent to [facility] per Palliative care request on 8/3- [facility] liaison is following.   IP CM to follow for coordination of Hospice needs on discharge.   Expected Discharge Plan: Home w Hospice Care Barriers to Discharge: Continued Medical Work up   Patient Goals and CMS Choice Patient states their goals for this hospitalization and ongoing recovery are:: return home w/ hospice   Choice offered to / list presented to : Adult Children      Expected Discharge Plan and Services   Discharge Planning Services: CM Consult Post Acute Care Choice: Hospice Living arrangements for the past 2 months: Single Family Home                             [facility] Agency: [facility] [employer] Date [facility] Agency Contacted: 06/29/24   Representative spoke with at [facility] Agency: Amy  Prior Living Arrangements/Services Living arrangements for the past 2 months: Single Family Home Lives with:: Spouse, Adult Children Patient language and need for interpreter reviewed:: Yes (Vietnamese)        Need for Family Participation in Patient Care: Yes (Comment) Care giver support system in place?: Yes (comment)   Criminal Activity/Legal Involvement Pertinent to Current Situation/Hospitalization: No - Comment as needed  Activities of Daily Living      Permission Sought/Granted                  Emotional Assessment         Alcohol  / Substance Use: Not Applicable Psych Involvement: No (comment)  Admission diagnosis:  Sepsis (HCC) [A41.9] Patient Active Problem List   Diagnosis Date Noted   Norovirus 06/29/2024   Pressure injury of skin 06/29/2024   Sepsis (HCC) 06/28/2024   Altered mental status 06/28/2024   Acute kidney injury superimposed on chronic kidney disease (HCC) 06/28/2024   High anion gap metabolic acidosis 06/28/2024   PTSD (post-traumatic stress disorder) 06/28/2024   Aortic stenosis 06/28/2024   History of GI bleed 06/28/2024   Severe left ventricular hypertrophy 03/09/2019   Alzheimer's disease (HCC) 01/07/2018   Benign prostatic hyperplasia 01/07/2018   Acute blood loss anemia 01/07/2018   Symptomatic anemia 01/07/2018   GI bleed 01/07/2018   Age-related memory disorder 02/24/2013   HTN (hypertension) 10/02/2011   Hyperlipidemia 10/02/2011   Hematuria 10/02/2011   PCP:  [name], MD Pharmacy:   [id number] [place] [place], [place] - [address] AT [facility] & [address] [place] [address] Phone: [phone] Fax: [phone]     Social Drivers of Health (SDOH) Social History: SDOH Screenings   Food Insecurity: Patient Unable To Answer (06/29/2024)  Housing: Patient Unable To Answer (06/29/2024)  Transportation Needs: Patient Unable To Answer (06/29/2024)  Utilities: Patient Unable To  Answer (06/29/2024)  Social Connections: Patient Unable To Answer (06/29/2024)  Tobacco Use: Low Risk  (06/28/2024)   SDOH Interventions:     Readmission Risk Interventions     No data to display

## 2024-06-30 NOTE — Plan of Care (Signed)

## 2024-06-30 NOTE — Progress Notes (Signed)
 Speech Language Pathology Treatment: Dysphagia  Patient Details Name: Marc [name] MRN: [id number] DOB: [birth year] Today's Date: 06/30/2024 Time: 8680-8672 SLP Time Calculation (min) (ACUTE ONLY): 8 min  Assessment / Plan / Recommendation Clinical Impression  Pt was significantly more alert today. He took consecutive straw sips of thin liquids without signs clinically concerning for aspiration. Oral transit and clearance were prompt and complete with purees. Discussed in detail with pt's son, Marc [name], who states the pt was primarily eating soups and soft solids PTA. His family demonstrates understanding of pt's underlying dementia diagnosis and the impact that may have on swallowing. Discussed with MD as well, who is agreeable to initiating a full liquid diet with full supervision when he is awake/alert. SLP will f/u.    HPI HPI: Marc [name] is an 83 yo male with PMH of PTSD, Alzheimer's dementia, CKD 3a, essential HTN, aortic stenosis, prior GI bleed presenting to ED 8/2 with concern with GI bleed.      SLP Plan  Continue with current plan of care          Recommendations  Diet recommendations: Thin liquid Liquids provided via: Teaspoon;Cup;Straw Medication Administration: Crushed with puree Supervision: Staff to assist with self feeding;Full supervision/cueing for compensatory strategies;Trained caregiver to feed patient Compensations: Slow rate;Small sips/bites Postural Changes and/or Swallow Maneuvers: Seated upright 90 degrees                  Oral care QID   Frequent or constant Supervision/Assistance Dysphagia, unspecified (R13.10)     Continue with current plan of care     Damien Blumenthal, M.A., CCC-SLP Speech Language Pathology, Acute Rehabilitation Services  Secure Chat preferred [phone]   06/30/2024, 2:25 PM

## 2024-06-30 NOTE — Progress Notes (Signed)
   Palliative Medicine Inpatient Follow Up Note HPI: Marc Marc [name] is a 83 y.o. male with medical history significant of PTSD, dementia, CKD stage IIIa, hyperlipidemia, Alzheimer disease, essential hypertension, aortic stenosis, ventricular hypertrophy, prior GI bleed 2019 required blood transfusion underwent EGD showing esophagitis and colonoscopy showed hemorrhoid. She is here for ongoing BRBPR, adult FTT, and volume depletion. Palliative care has been asked to support goals of care conversation(s).   Today's Discussion 06/30/2024  *Please note that this is a verbal dictation therefore any spelling or grammatical errors are due to the Dragon Medical One system interpretation.  Chart reviewed inclusive of vital signs, progress notes, laboratory results, and diagnostic images. SBP's low though MAP stable.   I spoke with Marc [name]'s RN, Marc [name] this morning. She shares that Marc Marc [name] is awake and opens his eyes when spoken to.   I met with Marc Marc [name] this morning at bedside. He is awake and alert. He is not in any distress this morning.   Plan to continue present measures and discharge patient to home with hospice once medically optimized.   Questions and concerns addressed/Palliative Support Provided.   Objective Assessment: Vital Signs Vitals:   06/30/24 0800 06/30/24 1129  BP:  (!) 93/44  Pulse:  69  Resp:  20  Temp: 98.6 F (37 C) 99.4 F (37.4 C)  SpO2:  100%    Intake/Output Summary (Last 24 hours) at 06/30/2024 1135 Last data filed at 06/30/2024 0800 Gross per 24 hour  Intake 1663.36 ml  Output 235 ml  Net 1428.36 ml   Last Weight  Most recent update: 06/28/2024 12:12 AM    Weight  73 kg (160 lb 15 oz)            Gen: Elderly Marc Marc [name] male chronically ill in appearance HEENT: Dry mucous membranes CV: Regular rate and rhythm  PULM: On RA, breathing is nonlabored ABD: soft/nontender  EXT: No edema  Neuro: Oriented to self  SUMMARY OF RECOMMENDATIONS   DNAR/DNI  GOLD DNR  completed   Continue with present treatments  Plan for Discharge home with hospice once medically optimized   Ongoing palliative care support ______________________________________________________________________________________ Marc Marc [name] Palliative Medicine Team Team Cell Phone: [phone] Please utilize secure chat with additional questions, if there is no response within 30 minutes please call the above phone number  Time Spent: 65  Palliative Medicine Team providers are available by phone from 7am to 7pm daily and can be reached through the team cell phone.  Should this patient require assistance outside of these hours, please call the patient's attending physician.

## 2024-06-30 NOTE — Progress Notes (Signed)
 [facility]                                                                              Marc [name], is a 83 y.o. male, DOB - [birth year], FMW:[id number] Admit date - 06/28/2024    Outpatient Primary MD for the patient is [name], MD  LOS - 2  days  Chief Complaint  Patient presents with   Rectal Bleeding       Brief summary   Patient is 83 year old male with Alzheimer's dementia, PTSD CKD stage IIIa, HLP, HTN, aortic stenosis, prior GI bleed in 2019 required EGD which showed esophagitis and colonoscopy showed hemorrhoids, presented to ED with bright red blood per rectum.  Family reported ongoing diarrhea for almost a month, poor appetite, dehydration, worsening confusion.  In ED, physical exam did not reveal any active GI bleed per rectum. Family had reported that over the last month he has had chronic diarrhea and over the last year he has had recurrent GI bleeds.  Family requested DNR, no invasive interventions like EGD or colonoscopy however for now continue medical therapy.  Assessment & Plan    Severe Sepsis (HCC), Proteus bacteremia, likely from UTI Norovirus diarrhea  hypotensive - presented with leukocytosis, AMS, lactic acidosis, AKI, hypotension - CT abdomen pelvis showed irregular bladder wall thickening concern for cystitis otherwise no acute intra-abdominal finding. - Blood cultures positive for Proteus mirabilis, GI pathogen panel positive for norovirus - Antibiotics narrowed to IV Rocephin  - BP remains soft, unable to give midodrine  due to n.p.o., received IV albumin 's x 3.  Random cortisol level normal 31.0 - Alert and awake, does not follow verbal commands, opens eyes, no meaningful conversation.  Palliative medicine following, overall poor prognosis.  DNR.    Acute metabolic encephalopathy, superimposed on Alzheimer's dementia, PTSD  - likely due to #1 - palliative medicine consulted, likely end stage dementia, overall poor  prognosis   - Opens eyes, nods yes to ROS questions, otherwise no verbal conversation.   - Palliative medicine following   Acute kidney injury superimposed CKD stage stage 3b - admitted with Cr 3.27 in the setting chronic diarrhea, GIB, dehydration, poor oral intake, severe sepsis - baseline Cr 1.4-1.79.,  Plateaued at 4.27 - Creatinine improving, 1.74 on IV fluids  Acute respiratory failure with hypoxia - likely due to sepsis, possible fluid overload, was placed on 12L HFNC in ED - overall poor prognosis  - Results 100% on room air  Norovirus diarrhea - C. difficile negative - Continue symptomatic management, IV fluids  Hypernatremia - likely due to profound dehydration  - Improving, sodium 151 on admission, improved to 138 today - Continue D5   Hypokalemia - replace as needed  Transaminitis - Due to severe sepsis  Hypophosphatemia -K-Phos x 1   GI bleed-likely secondary from hemorrhoid History of GI bleed-secondary to esophagitis and hemorrhoid -Family reported that they have noticed bleeding in the patient's diaper/pants.  Patient has history of internal hemorrhoid underwent EGD and colonoscopy in the past. - continue  IV Protonix  and Anusol  suppository twice daily. -Family declined any invasive interventions     Essential hypertension, aortic stenosis -  Hypotensive, hold oral antihypertensives, placed on midodrine   Multiple wounds, POA Wound 06/28/24 1105 Pressure Injury Heel Right Deep Tissue Pressure Injury - Purple or maroon localized area of discolored intact skin or blood-filled blister due to damage of underlying soft tissue from pressure and/or shear. (Active)  Date First Assessed/Time First Assessed: 06/28/24 1105   Present on Original Admission: Yes  Primary Wound Type: Pressure Injury  Location: Heel  Location Orientation: Right  Staging: Deep Tissue Pressure Injury - Purple or maroon localized area of di...    Assessments 06/28/2024 11:00 AM 06/30/2024  8:00  AM  Wound Image      Site / Wound Assessment -- Clean;Dry  Peri-wound Assessment -- Intact  Dressing Type Foam - Lift dressing to assess site every shift Foam - Lift dressing to assess site every shift  Dressing Changed -- Reinforced  Margins -- Unattached edges (unapproximated)     No associated orders.     Wound 06/28/24 1106 Pressure Injury Coccyx Mid Unstageable - Full thickness tissue loss in which the base of the injury is covered by slough (yellow, tan, gray, green or brown) and/or eschar (tan, brown or black) in the wound bed. (Active)  Date First Assessed/Time First Assessed: 06/28/24 1106   Present on Original Admission: Yes  Primary Wound Type: Pressure Injury  Location: Coccyx  Location Orientation: Mid  Staging: Unstageable - Full thickness tissue loss in which the base of the i...    Assessments 06/28/2024 11:00 AM 06/30/2024  8:00 AM  Wound Image     Site / Wound Assessment -- Dry;Clean;Black  Peri-wound Assessment -- Black  Dressing Type Foam - Lift dressing to assess site every shift Foam - Lift dressing to assess site every shift  Dressing Changed -- Reinforced  Margins -- Unattached edges (unapproximated)  Non-staged Wound Description -- Not applicable     No associated orders.     Wound 06/28/24 1107 Pressure Injury Ankle Anterior;Left;Posterior Deep Tissue Pressure Injury - Purple or maroon localized area of discolored intact skin or blood-filled blister due to damage of underlying soft tissue from pressure and/or shear. (Active)  Date First Assessed/Time First Assessed: 06/28/24 1107   Present on Original Admission: Yes  Primary Wound Type: Pressure Injury  Location: Ankle  Location Orientation: Anterior;Left;Posterior  Staging: Deep Tissue Pressure Injury - Purple or maroon l...    Assessments 06/28/2024 11:00 AM 06/30/2024  8:00 AM  Wound Image     Site / Wound Assessment -- Clean;Dry  Peri-wound Assessment -- Intact  Dressing Type Foam - Lift dressing to assess site  every shift Foam - Lift dressing to assess site every shift  Dressing Changed -- Reinforced  Margins -- Unattached edges (unapproximated)     No associated orders.     Wound 06/28/24 1108 Pressure Injury Thigh Anterior;Left;Proximal Stage 1 -  Intact skin with non-blanchable redness of a localized area usually over a bony prominence. (Active)  Date First Assessed/Time First Assessed: 06/28/24 1108   Present on Original Admission: Yes  Primary Wound Type: Pressure Injury  Location: Thigh  Location Orientation: Anterior;Left;Proximal  Staging: Stage 1 -  Intact skin with non-blanchable rednes...    Assessments 06/28/2024 11:00 AM 06/30/2024  8:00 AM  Wound Image     Site / Wound Assessment -- Clean;Dry  Peri-wound Assessment -- Intact  Dressing Type Foam - Lift dressing to assess site every shift Foam - Lift dressing to assess site every shift  Dressing Changed -- Reinforced  Dressing Status -- Clean, Dry,  Intact  Margins -- Unattached edges (unapproximated)     No associated orders.     Wound 06/28/24 1108 Pressure Injury Back Right Stage 1 -  Intact skin with non-blanchable redness of a localized area usually over a bony prominence. (Active)  Date First Assessed/Time First Assessed: 06/28/24 1108   Present on Original Admission: Yes  Primary Wound Type: Pressure Injury  Location: Back  Location Orientation: Right  Staging: Stage 1 -  Intact skin with non-blanchable redness of a localized a...    Assessments 06/28/2024 11:00 AM 06/30/2024  8:00 AM  Wound Image     Site / Wound Assessment -- Dry;Clean  Peri-wound Assessment -- Intact  Dressing Type Foam - Lift dressing to assess site every shift Foam - Lift dressing to assess site every shift  Dressing Changed -- Reinforced  Margins -- Attached edges (approximated)     No associated orders.     Wound 06/28/24 1110 Pressure Injury Knee Anterior;Left Stage 1 -  Intact skin with non-blanchable redness of a localized area usually over a bony  prominence. (Active)  Date First Assessed/Time First Assessed: 06/28/24 1110   Present on Original Admission: Yes  Primary Wound Type: Pressure Injury  Location: Knee  Location Orientation: Anterior;Left  Staging: Stage 1 -  Intact skin with non-blanchable redness of a loc...    Assessments 06/28/2024 11:00 AM 06/30/2024  8:00 AM  Wound Image     Site / Wound Assessment -- Clean;Dry  Peri-wound Assessment -- Intact  Dressing Type Foam - Lift dressing to assess site every shift Foam - Lift dressing to assess site every shift  Dressing Changed -- Reinforced  Dressing Status -- Clean, Dry, Intact  Margins -- Attached edges (approximated)     No associated orders.     Estimated body mass index is 27.62 kg/m as calculated from the following:   Height as of this encounter: 5' 4 (1.626 m).   Weight as of this encounter: 73 kg.  Code Status: DNR, DNI  DVT Prophylaxis:  SCDs Start: 06/28/24 0232 Place TED hose Start: 06/28/24 0232   Level of Care: Level of care: Progressive Family Communication: Discussed at length with patient's son at the bedside Disposition Plan:      Remains inpatient appropriate: Hospice when medically stable to discharge.  Overall poor prognosis   Procedures:    Consultants:   Palliative   Antimicrobials:   Anti-infectives (From admission, onward)    Start     Dose/Rate Route Frequency Ordered Stop   06/29/24 0500  ceFEPIme  (MAXIPIME ) 2 g in sodium chloride  0.9 % 100 mL IVPB  Status:  Discontinued        2 g 200 mL/hr over 30 Minutes Intravenous Every 24 hours 06/28/24 0243 06/28/24 1804   06/28/24 2200  cefTRIAXone  (ROCEPHIN ) 2 g in sodium chloride  0.9 % 100 mL IVPB        2 g 200 mL/hr over 30 Minutes Intravenous Every 24 hours 06/28/24 1804     06/28/24 0242  vancomycin  variable dose per unstable renal function (pharmacist dosing)  Status:  Discontinued         Does not apply See admin instructions 06/28/24 0243 06/28/24 1357   06/28/24 0215  ceFEPIme   (MAXIPIME ) 2 g in sodium chloride  0.9 % 100 mL IVPB        2 g 200 mL/hr over 30 Minutes Intravenous  Once 06/28/24 0205 06/28/24 0301   06/28/24 0215  vancomycin  (VANCOREADY) IVPB 1500 mg/300 mL  1,500 mg 150 mL/hr over 120 Minutes Intravenous  Once 06/28/24 0205 06/28/24 0505          Medications  Chlorhexidine  Gluconate Cloth  6 each Topical Daily   hydrocortisone   1 Application Rectal BID   pantoprazole  (PROTONIX ) IV  40 mg Intravenous Q12H   sodium chloride  flush  10-40 mL Intracatheter Q12H   sodium chloride  flush  3 mL Intravenous Q12H   sodium chloride  flush  3 mL Intravenous Q12H      Subjective:   Marc [name] was seen and examined today.  Alert and awake, opens eyes to verbal commands, nods to few questions otherwise no meaningful conversation or following commands for exam.  Room air.  O2 sats 100% on room air.    Objective:   Vitals:   06/30/24 0343 06/30/24 0405 06/30/24 0600 06/30/24 0800  BP: (!) 85/48 (!) 88/40 (!) 89/42   Pulse: 68 70 65   Resp: 18 20 15    Temp: 98.3 F (36.8 C)   98.6 F (37 C)  TempSrc: Axillary   Axillary  SpO2: 100% 99% 100%   Weight:      Height:        Intake/Output Summary (Last 24 hours) at 06/30/2024 1055 Last data filed at 06/30/2024 0800 Gross per 24 hour  Intake 1663.36 ml  Output 735 ml  Net 928.36 ml     Wt Readings from Last 3 Encounters:  06/28/24 73 kg  08/29/20 73.5 kg  03/22/20 73.8 kg   Physical Exam General: Alert and awake, responds to voice, opens eyes Cardiovascular: S1 S2 clear, RRR.  Respiratory: Diminished breath sounds at the bases, no wheezing Gastrointestinal: Soft, nontender, nondistended, NBS Ext: no pedal edema bilaterally Neuro: Does not follow verbal commands Psych: Dementia   Data Reviewed:  I have personally reviewed following labs    CBC Lab Results  Component Value Date   WBC 16.4 (H) 06/30/2024   RBC 3.48 (L) 06/30/2024   HGB 11.5 (L) 06/30/2024   HCT 34.3 (L)  06/30/2024   MCV 98.6 06/30/2024   MCH 33.0 06/30/2024   PLT 80 (L) 06/30/2024   MCHC 33.5 06/30/2024   RDW 14.6 06/30/2024   LYMPHSABS 0.8 06/28/2024   MONOABS 1.0 06/28/2024   EOSABS 0.0 06/28/2024   BASOSABS 0.0 06/28/2024     Last metabolic panel Lab Results  Component Value Date   NA 138 06/30/2024   K 3.5 06/30/2024   CL 107 06/30/2024   CO2 22 06/30/2024   BUN 40 (H) 06/30/2024   CREATININE 1.74 (H) 06/30/2024   GLUCOSE 115 (H) 06/30/2024   GFRNONAA 39 (L) 06/30/2024   GFRAA 55 (L) 08/29/2020   CALCIUM  7.6 (L) 06/30/2024   PHOS 1.8 (L) 06/30/2024   PROT 5.5 (L) 06/30/2024   ALBUMIN  2.7 (L) 06/30/2024   LABGLOB 2.4 01/24/2017   AGRATIO 2.0 01/24/2017   BILITOT 1.1 06/30/2024   ALKPHOS 99 06/30/2024   AST 48 (H) 06/30/2024   ALT 39 06/30/2024   ANIONGAP 9 06/30/2024    CBG (last 3)  No results for input(s): GLUCAP in the last 72 hours.    Coagulation Profile: Recent Labs  Lab 06/28/24 0045  INR 1.3*     Radiology Studies: I have personally reviewed the imaging studies  No results found.      Nydia Distance M.D. [facility] 06/30/2024, 10:55 AM  Available via Epic secure chat 7am-7pm After 7 pm, please refer to night coverage provider listed on amion.

## 2024-06-30 NOTE — Progress Notes (Signed)

## 2024-06-30 NOTE — Progress Notes (Signed)
 MEWS Progress Note  Patient Details Name: Marc [name] MRN: [id number] DOB: 1941/10/30 Today's Date: 06/30/2024   MEWS Flowsheet Documentation:  Assess: MEWS Score Temp: 99.5 F (37.5 C) BP: (!) 77/57 MAP (mmHg): 65 Pulse Rate: 79 ECG Heart Rate: 89 Resp: 18 Level of Consciousness: Alert SpO2: 100 % O2 Device: Room Air O2 Flow Rate (L/min): 0 L/min Assess: MEWS Score MEWS Temp: 0 MEWS Systolic: 2 MEWS Pulse: 0 MEWS RR: 0 MEWS LOC: 0 MEWS Score: 2 MEWS Score Color: Yellow Assess: SIRS CRITERIA SIRS Temperature : 0 SIRS Respirations : 0 SIRS Pulse: 0 SIRS WBC: 0 SIRS Score Sum : 0 SIRS Temperature : 0 SIRS Pulse: 0 SIRS Respirations : 0 SIRS WBC: 1 SIRS Score Sum : 1 Assess: if the MEWS score is Yellow or Red Were vital signs accurate and taken at a resting state?: Yes Does the patient meet 2 or more of the SIRS criteria?: No MEWS guidelines implemented : Yes, yellow Treat MEWS Interventions: Considered administering scheduled or prn medications/treatments as ordered Take Vital Signs Increase Vital Sign Frequency : Yellow: Q2hr x1, continue Q4hrs until patient remains green for 12hrs Escalate MEWS: Escalate: Yellow: Discuss with charge nurse and consider notifying provider and/or RRT Notify: Charge Nurse/RN Name of Charge Nurse/RN Notified: Nena Provider Notification Provider Name/Title: Rai Date Provider Notified: 06/30/24 Time Provider Notified: 1706 Method of Notification: Page Notification Reason: Other (Comment) (MEWS) Provider response: No new orders Date of Provider Response: 06/30/24 Time of Provider Response: 1706      [name] JAYSON Rao 06/30/2024, 5:06 PM

## 2024-07-01 DIAGNOSIS — A0811 Acute gastroenteropathy due to Norwalk agent: Secondary | ICD-10-CM

## 2024-07-01 DIAGNOSIS — A419 Sepsis, unspecified organism: Secondary | ICD-10-CM

## 2024-07-01 DIAGNOSIS — G309 Alzheimer's disease, unspecified: Secondary | ICD-10-CM | POA: Diagnosis not present

## 2024-07-01 DIAGNOSIS — Z515 Encounter for palliative care: Secondary | ICD-10-CM | POA: Diagnosis not present

## 2024-07-01 DIAGNOSIS — Z7189 Other specified counseling: Secondary | ICD-10-CM | POA: Diagnosis not present

## 2024-07-01 DIAGNOSIS — N179 Acute kidney failure, unspecified: Secondary | ICD-10-CM | POA: Diagnosis not present

## 2024-07-01 LAB — COMPREHENSIVE METABOLIC PANEL WITH GFR
ALT: 35 U/L (ref 0–44)
AST: 44 U/L — ABNORMAL HIGH (ref 15–41)
Albumin: 2.3 g/dL — ABNORMAL LOW (ref 3.5–5.0)
Alkaline Phosphatase: 84 U/L (ref 38–126)
Anion gap: 8 (ref 5–15)
BUN: 22 mg/dL (ref 8–23)
CO2: 21 mmol/L — ABNORMAL LOW (ref 22–32)
Calcium: 7.3 mg/dL — ABNORMAL LOW (ref 8.9–10.3)
Chloride: 108 mmol/L (ref 98–111)
Creatinine, Ser: 1.35 mg/dL — ABNORMAL HIGH (ref 0.61–1.24)
GFR, Estimated: 52 mL/min — ABNORMAL LOW (ref >60)
Glucose, Bld: 115 mg/dL — ABNORMAL HIGH (ref 70–99)
Potassium: 3 mmol/L — ABNORMAL LOW (ref 3.5–5.1)
Sodium: 137 mmol/L (ref 135–145)
Total Bilirubin: 1 mg/dL (ref 0.0–1.2)
Total Protein: 4.9 g/dL — ABNORMAL LOW (ref 6.5–8.1)

## 2024-07-01 LAB — CBC
HCT: 32.8 % — ABNORMAL LOW (ref 39.0–52.0)
Hemoglobin: 11.1 g/dL — ABNORMAL LOW (ref 13.0–17.0)
MCH: 33 pg (ref 26.0–34.0)
MCHC: 33.8 g/dL (ref 30.0–36.0)
MCV: 97.6 fL (ref 80.0–100.0)
Platelets: 87 K/uL — ABNORMAL LOW (ref 150–400)
RBC: 3.36 MIL/uL — ABNORMAL LOW (ref 4.22–5.81)
RDW: 14.1 % (ref 11.5–15.5)
WBC: 10.7 K/uL — ABNORMAL HIGH (ref 4.0–10.5)
nRBC: 0 % (ref 0.0–0.2)

## 2024-07-01 LAB — PHOSPHORUS: Phosphorus: 2.4 mg/dL — ABNORMAL LOW (ref 2.5–4.6)

## 2024-07-01 LAB — MAGNESIUM: Magnesium: 1.7 mg/dL (ref 1.7–2.4)

## 2024-07-01 MED ORDER — DEXTROSE 5 % IV SOLN: INTRAVENOUS | Status: AC

## 2024-07-01 MED ORDER — POTASSIUM & SODIUM PHOSPHATES 280-160-250 MG PO PACK
1.0000 | PACK | Freq: Three times a day (TID) | ORAL | Status: DC
  Administered 2024-07-01 – 2024-07-02 (×3): 1 via ORAL
  Filled 2024-07-01 (×8): qty 1

## 2024-07-01 MED ORDER — POTASSIUM CHLORIDE CRYS ER 20 MEQ PO TBCR
40.0000 meq | EXTENDED_RELEASE_TABLET | Freq: Two times a day (BID) | ORAL | Status: AC
  Filled 2024-07-01 (×2): qty 2

## 2024-07-01 NOTE — Progress Notes (Signed)
 [facility] Liaison Note  Received request from Transitions of Care Manager for hospice services at home after discharge. Spoke with patient's son Willene to initiate education related to hospice philosophy, services, and team approach to care.   Nang verbalized understanding of information given. Per discussion, the plan is for discharge home by [facility] tomorrow 8/7.  DME needs discussed. Patient has a bed and wheelchair in the home. Willene says that he doesn't need anything else at this time. The address has been verified and is correct in the chart. Willene is the family contact to arrange time of equipment delivery.  Please send signed and completed DNR home with patient/family. Please provide prescriptions at discharge as needed to ensure ongoing symptom management.  [facility] information and contact numbers given to [place]. Above information shared with Transitions of Care Manager. Please call with any questions or concerns.  Thank you for the opportunity to participate in this patient's care.   [facility] Liaison [phone]

## 2024-07-01 NOTE — Plan of Care (Signed)

## 2024-07-01 NOTE — Progress Notes (Signed)
   Palliative Medicine Inpatient Follow Up Note HPI: Marc [name] is a 83 y.o. male with medical history significant of PTSD, dementia, CKD stage IIIa, hyperlipidemia, Alzheimer disease, essential hypertension, aortic stenosis, ventricular hypertrophy, prior GI bleed 2019 required blood transfusion underwent EGD showing esophagitis and colonoscopy showed hemorrhoid. She is here for ongoing BRBPR, adult FTT, and volume depletion. Palliative care has been asked to support goals of care conversation(s).   Today's Discussion 07/01/2024  *Please note that this is a verbal dictation therefore any spelling or grammatical errors are due to the Dragon Medical One system interpretation.  Chart reviewed inclusive of vital signs, progress notes, laboratory results, and diagnostic images.   I met with [name]'s RN, Natalie this morning. She is cleaning him up as his rectal tube dislodged overnight. She shares he had apparently yelled out a few times overnight.   [name] himself appears to be in good spirits this morning it appears although he is not verbal he does open his eyes, nod, and lift his arms. He exemplifies no s/s of distress at this time.   Have reached out to [facility] liaison this morning to solidify the plan for home with their hospice services.   Questions and concerns addressed/Palliative Support Provided.   Objective Assessment: Vital Signs Vitals:   07/01/24 0656 07/01/24 0811  BP: (!) 90/57 (!) 91/41  Pulse:    Resp:    Temp:  99.6 F (37.6 C)  SpO2:      Intake/Output Summary (Last 24 hours) at 07/01/2024 0847 Last data filed at 07/01/2024 0800 Gross per 24 hour  Intake 2222.3 ml  Output 1300 ml  Net 922.3 ml   Last Weight  Most recent update: 06/28/2024 12:12 AM    Weight  73 kg (160 lb 15 oz)            Gen: Elderly [place] male chronically ill in appearance HEENT: Dry mucous membranes CV: Regular rate and rhythm  PULM: On RA, breathing is nonlabored ABD:  soft/nontender  EXT: No edema  Neuro: Aware of self  SUMMARY OF RECOMMENDATIONS   DNAR/DNI  GOLD DNR completed   Continue with present treatments  Plan for Discharge home with [name] once medically optimized   Ongoing palliative care support ______________________________________________________________________________________ [name] [place] Palliative Medicine Team Team Cell Phone: [phone] Please utilize secure chat with additional questions, if there is no response within 30 minutes please call the above phone number  Time Spent: 25  Palliative Medicine Team providers are available by phone from 7am to 7pm daily and can be reached through the team cell phone.  Should this patient require assistance outside of these hours, please call the patient's attending physician.

## 2024-07-01 NOTE — Plan of Care (Signed)

## 2024-07-01 NOTE — Discharge Summary (Signed)
 Physician Discharge Summary   Patient: Marc [name] MRN: [id number] DOB: Feb 11, 1941  Admit date:     06/28/2024  Discharge date: 07/02/24  Discharge Physician: Elgie Butter   PCP: [name], MD   Recommendations at discharge:  Please follow up with hospice MD as recommended.   Discharge Diagnoses: Principal Problem:   Sepsis (HCC) Active Problems:   Altered mental status   Acute kidney injury superimposed on chronic kidney disease (HCC)   Alzheimer's disease (HCC)   GI bleed   High anion gap metabolic acidosis   PTSD (post-traumatic stress disorder)   Aortic stenosis   History of GI bleed   Norovirus   Pressure injury of skin  Resolved Problems:   * No resolved hospital problems. [facility] Course: Patient is 83 year old male with Alzheimer's dementia, PTSD CKD stage IIIa, HLP, HTN, aortic stenosis, prior GI bleed in 2019 required EGD which showed esophagitis and colonoscopy showed hemorrhoids, presented to ED with bright red blood per rectum.  Family reported ongoing diarrhea for almost a month, poor appetite, dehydration, worsening confusion.  In ED, physical exam did not reveal any active GI bleed per rectum. Family had reported that over the last month he has had chronic diarrhea and over the last year he has had recurrent GI bleeds.  Family requested DNR, no invasive interventions like EGD or colonoscopy however for now continue medical therapy.  Assessment and Plan:   Severe Sepsis (HCC), Proteus bacteremia, likely from UTI Norovirus diarrhea  hypotensive - presented with leukocytosis, AMS, lactic acidosis, AKI, hypotension - CT abdomen pelvis showed irregular bladder wall thickening concern for cystitis otherwise no acute intra-abdominal finding. - Blood cultures positive for Proteus mirabilis, GI pathogen panel positive for norovirus - Antibiotics narrowed to IV Rocephin  transition to oral augmentin  on discharge tomorrow.  - BP remains soft, unable to  give midodrine  due to n.p.o., received IV albumin 's x 3.  Random cortisol level normal 31.0 - Alert and awake, does not follow verbal commands, opens eyes, no meaningful conversation.  Palliative medicine following, overall poor prognosis.  DNR.     Acute metabolic encephalopathy, superimposed on Alzheimer's dementia, PTSD  - likely due to #1 - palliative medicine consulted, likely end stage dementia, overall poor prognosis   - Opens eyes, nods yes to ROS questions, otherwise no verbal conversation.   - Palliative medicine following   Acute kidney injury superimposed CKD stage stage 3b - admitted with Cr 3.27 in the setting chronic diarrhea, GIB, dehydration, poor oral intake, severe sepsis - baseline Cr 1.4-1.79.,  Plateaued at 4.27 - Creatinine improving, 1.74 on IV fluids   Acute respiratory failure with hypoxia - likely due to sepsis, possible fluid overload, was placed on 12L HFNC in ED - overall poor prognosis  - Results 100% on room air   Norovirus diarrhea - C. difficile negative - Continue symptomatic management,   Hypernatremia - likely due to profound dehydration  - Improving, sodium 151 on admission, improved to 138 today    Hypokalemia - replace as needed   Transaminitis - Due to severe sepsis   Hypophosphatemia -K-Phos x 1   GI bleed-likely secondary from hemorrhoid History of GI bleed-secondary to esophagitis and hemorrhoid -Family reported that they have noticed bleeding in the patient's diaper/pants.  Patient has history of internal hemorrhoid underwent EGD and colonoscopy in the past. -Family declined any invasive interventions     Essential hypertension, aortic stenosis - Hypotensive, hold oral antihypertensives, placed on midodrine    Multiple wounds,  POA  Wound care consulted and recommendations given     Consultants: palliative care.  Procedures performed: none.   Disposition: Hospice care Diet recommendation:  Full liquid diet DISCHARGE  MEDICATION:   Discharge Exam: Filed Weights   06/28/24 0011  Weight: 73 kg   Ill appearing gentleman not in distress.  CVS s1s2 Lungs diminished air entry    Condition at discharge: poor  The results of significant diagnostics from this hospitalization (including imaging, microbiology, ancillary and laboratory) are listed below for reference.   Imaging Studies: CT ABDOMEN PELVIS WO CONTRAST Result Date: 06/28/2024 EXAM: CT ABDOMEN AND PELVIS WITHOUT CONTRAST 06/28/2024 01:45:50 AM TECHNIQUE: CT of the abdomen and pelvis was performed without the administration of intravenous contrast. Multiplanar reformatted images are provided for review. Automated exposure control, iterative reconstruction, and/or weight based adjustment of the mA/kV was utilized to reduce the radiation dose to as low as reasonably achievable. COMPARISON: CT from 08/29/2020. CLINICAL HISTORY: Abdominal pain, acute, nonlocalized. Patient BIB [facility] from home. Family states they were changing patient's brief and noticed dark red blood, first started today. FINDINGS: LOWER CHEST: No acute abnormality. LIVER: The liver is unremarkable. GALLBLADDER AND BILE DUCTS: Gallbladder is unremarkable. No biliary ductal dilatation. SPLEEN: No acute abnormality. PANCREAS: No acute abnormality. ADRENAL GLANDS: No acute abnormality. KIDNEYS, URETERS AND BLADDER: Multiple bilateral renal cystic lesions, some of which are mildly hyperdense. These were compatible with benign cysts on CT from 08/29/2020. The mild hyperdensity may be due to hemorrhagic or proteinaceous transformation. No follow-up recommended. Irregular bladder wall thickening. Correlate with urinalysis for cystitis and consider cystoscopy to evaluate for mass. Evaluation for active bleeding is not possible on noncontrast exam. No hydronephrosis. No perinephric or periureteral stranding. GI AND BOWEL: Stomach demonstrates no acute abnormality. There is no bowel obstruction. No bowel  wall thickening. PERITONEUM AND RETROPERITONEUM: No ascites. No free air. VASCULATURE: Aortic atherosclerotic calcification. LYMPH NODES: No lymphadenopathy. REPRODUCTIVE ORGANS: No acute abnormality. BONES AND SOFT TISSUES: No acute osseous abnormality. No focal soft tissue abnormality. IMPRESSION: 1. Irregular bladder wall thickening. Correlate with urinalysis for cystitis and consider cystoscopy to evaluate for mass. Electronically signed by: Norman Gatlin MD 06/28/2024 01:56 AM EDT RP Workstation: [id number]    Microbiology: Results for orders placed or performed during the hospital encounter of 06/28/24  Blood culture (routine x 2)     Status: Abnormal   Collection Time: 06/28/24  2:06 AM   Specimen: BLOOD  Result Value Ref Range Status   Specimen Description BLOOD RIGHT ANTECUBITAL  Final   Special Requests   Final    BOTTLES DRAWN AEROBIC AND ANAEROBIC Blood Culture results may not be optimal due to an inadequate volume of blood received in culture bottles   Culture  Setup Time   Final    GRAM NEGATIVE RODS IN BOTH AEROBIC AND ANAEROBIC BOTTLES CRITICAL RESULT CALLED TO, READ BACK BY AND VERIFIED WITH: MAYA SHARPER BITONTI [id number] BY JINNY COMMON, [place] Performed at [facility], [address] [place], [place] [address]    Culture PROTEUS MIRABILIS (A)  Final   Report Status 06/30/2024 FINAL  Final   Organism ID, Bacteria PROTEUS MIRABILIS  Final   Organism ID, Bacteria PROTEUS MIRABILIS  Final      Susceptibility   Proteus mirabilis - KIRBY BAUER*    CEFAZOLIN  SENSITIVE Sensitive    Proteus mirabilis - MIC*    AMPICILLIN <=2 SENSITIVE Sensitive     CEFEPIME  <=0.12 SENSITIVE Sensitive     CEFTAZIDIME <=  1 SENSITIVE Sensitive     CEFTRIAXONE  <=0.25 SENSITIVE Sensitive     CIPROFLOXACIN <=0.25 SENSITIVE Sensitive     GENTAMICIN <=1 SENSITIVE Sensitive     IMIPENEM 8 INTERMEDIATE Intermediate     TRIMETH/SULFA <=20 SENSITIVE Sensitive     AMPICILLIN/SULBACTAM <=2  SENSITIVE Sensitive     PIP/TAZO <=4 SENSITIVE Sensitive ug/mL    * PROTEUS MIRABILIS    PROTEUS MIRABILIS  Blood Culture ID Panel (Reflexed)     Status: Abnormal   Collection Time: 06/28/24  2:06 AM  Result Value Ref Range Status   Enterococcus faecalis NOT DETECTED NOT DETECTED Final   Enterococcus Faecium NOT DETECTED NOT DETECTED Final   Listeria monocytogenes NOT DETECTED NOT DETECTED Final   Staphylococcus species NOT DETECTED NOT DETECTED Final   Staphylococcus aureus (BCID) NOT DETECTED NOT DETECTED Final   Staphylococcus epidermidis NOT DETECTED NOT DETECTED Final   Staphylococcus lugdunensis NOT DETECTED NOT DETECTED Final   Streptococcus species NOT DETECTED NOT DETECTED Final   Streptococcus agalactiae NOT DETECTED NOT DETECTED Final   Streptococcus pneumoniae NOT DETECTED NOT DETECTED Final   Streptococcus pyogenes NOT DETECTED NOT DETECTED Final   A.calcoaceticus-baumannii NOT DETECTED NOT DETECTED Final   Bacteroides fragilis NOT DETECTED NOT DETECTED Final   Enterobacterales DETECTED (A) NOT DETECTED Final    Comment: Enterobacterales represent a large order of gram negative bacteria, not a single organism.   Enterobacter cloacae complex NOT DETECTED NOT DETECTED Final   Escherichia coli NOT DETECTED NOT DETECTED Final   Klebsiella aerogenes NOT DETECTED NOT DETECTED Final   Klebsiella oxytoca NOT DETECTED NOT DETECTED Final   Klebsiella pneumoniae NOT DETECTED NOT DETECTED Final   Proteus species DETECTED (A) NOT DETECTED Final    Comment: CRITICAL RESULT CALLED TO, READ BACK BY AND VERIFIED WITH: PHARMD MICHAEL BITONTI [id number] BY J RAZZAK, MT    Salmonella species NOT DETECTED NOT DETECTED Final   Serratia marcescens NOT DETECTED NOT DETECTED Final   Haemophilus influenzae NOT DETECTED NOT DETECTED Final   Neisseria meningitidis NOT DETECTED NOT DETECTED Final   Pseudomonas aeruginosa NOT DETECTED NOT DETECTED Final   Stenotrophomonas maltophilia NOT  DETECTED NOT DETECTED Final   Candida albicans NOT DETECTED NOT DETECTED Final   Candida auris NOT DETECTED NOT DETECTED Final   Candida glabrata NOT DETECTED NOT DETECTED Final   Candida krusei NOT DETECTED NOT DETECTED Final   Candida parapsilosis NOT DETECTED NOT DETECTED Final   Candida tropicalis NOT DETECTED NOT DETECTED Final   Cryptococcus neoformans/gattii NOT DETECTED NOT DETECTED Final   CTX-M ESBL NOT DETECTED NOT DETECTED Final   Carbapenem resistance IMP NOT DETECTED NOT DETECTED Final   Carbapenem resistance KPC NOT DETECTED NOT DETECTED Final   Carbapenem resistance NDM NOT DETECTED NOT DETECTED Final   Carbapenem resist OXA 48 LIKE NOT DETECTED NOT DETECTED Final   Carbapenem resistance VIM NOT DETECTED NOT DETECTED Final    Comment: Performed at [facility], [address] [place], [place] [address]  Blood culture (routine x 2)     Status: Abnormal   Collection Time: 06/28/24  2:11 AM   Specimen: BLOOD  Result Value Ref Range Status   Specimen Description BLOOD LEFT ANTECUBITAL  Final   Special Requests   Final    BOTTLES DRAWN AEROBIC AND ANAEROBIC Blood Culture results may not be optimal due to an inadequate volume of blood received in culture bottles   Culture  Setup Time   Final    GRAM  NEGATIVE RODS IN BOTH AEROBIC AND ANAEROBIC BOTTLES CRITICAL VALUE NOTED.  VALUE IS CONSISTENT WITH PREVIOUSLY REPORTED AND CALLED VALUE.    Culture (A)  Final    PROTEUS MIRABILIS SUSCEPTIBILITIES PERFORMED ON PREVIOUS CULTURE WITHIN THE LAST 5 DAYS. Performed at [facility], [address], [place], [place] [address]    Report Status 06/30/2024 FINAL  Final  Gastrointestinal Panel by PCR , Stool     Status: Abnormal   Collection Time: 06/28/24  2:26 AM   Specimen: Stool  Result Value Ref Range Status   Campylobacter species NOT DETECTED NOT DETECTED Final   Plesimonas shigelloides NOT DETECTED NOT DETECTED Final   Salmonella species NOT DETECTED NOT  DETECTED Final   Yersinia enterocolitica NOT DETECTED NOT DETECTED Final   Vibrio species NOT DETECTED NOT DETECTED Final   Vibrio cholerae NOT DETECTED NOT DETECTED Final   Enteroaggregative E coli (EAEC) NOT DETECTED NOT DETECTED Final   Enteropathogenic E coli (EPEC) NOT DETECTED NOT DETECTED Final   Enterotoxigenic E coli (ETEC) NOT DETECTED NOT DETECTED Final   Shiga like toxin producing E coli (STEC) NOT DETECTED NOT DETECTED Final   Shigella/Enteroinvasive E coli (EIEC) NOT DETECTED NOT DETECTED Final   Cryptosporidium NOT DETECTED NOT DETECTED Final   Cyclospora cayetanensis NOT DETECTED NOT DETECTED Final   Entamoeba histolytica NOT DETECTED NOT DETECTED Final   Giardia lamblia NOT DETECTED NOT DETECTED Final   Adenovirus F40/41 NOT DETECTED NOT DETECTED Final   Astrovirus NOT DETECTED NOT DETECTED Final   Norovirus GI/GII DETECTED (A) NOT DETECTED Final    Comment: RESULT CALLED TO, READ BACK BY AND VERIFIED WITH: ANGIE TINCHER ON 06/28/24 AT 1348 QSD    Rotavirus A NOT DETECTED NOT DETECTED Final   Sapovirus (I, II, IV, and V) NOT DETECTED NOT DETECTED Final    Comment: Performed at [facility], [address] [place], [place] [address]  C Difficile Quick Screen w PCR reflex     Status: None   Collection Time: 06/28/24  2:27 AM   Specimen: Stool  Result Value Ref Range Status   C Diff antigen NEGATIVE NEGATIVE Final   C Diff toxin NEGATIVE NEGATIVE Final   C Diff interpretation No C. difficile detected.  Final    Comment: Performed at [facility], [address], [place], [place] [address]  Urine Culture     Status: Abnormal   Collection Time: 06/28/24  4:59 AM   Specimen: Urine, Clean Catch  Result Value Ref Range Status   Specimen Description URINE, CLEAN CATCH  Final   Special Requests NONE  Final   Culture (A)  Final    <10,000 COLONIES/mL INSIGNIFICANT GROWTH Performed at [facility], [address], [place], [place] [address]    Report  Status 06/29/2024 FINAL  Final  Culture, blood (Routine X 2) w Reflex to ID Panel     Status: None (Preliminary result)   Collection Time: 06/29/24 11:33 AM   Specimen: BLOOD RIGHT HAND  Result Value Ref Range Status   Specimen Description BLOOD RIGHT HAND  Final   Special Requests   Final    BOTTLES DRAWN AEROBIC AND ANAEROBIC Blood Culture adequate volume   Culture   Final    NO GROWTH 2 DAYS Performed at [facility], [address] [place], [place] [address]    Report Status PENDING  Incomplete  Culture, blood (Routine X 2) w Reflex to ID Panel  Status: None (Preliminary result)   Collection Time: 06/29/24 11:45 AM   Specimen: BLOOD  Result Value Ref Range Status   Specimen Description BLOOD BLOOD RIGHT HAND  Final   Special Requests NONE  Final   Culture   Final    NO GROWTH 2 DAYS Performed at [facility], [address] [place], [place] [address]    Report Status PENDING  Incomplete    Labs: CBC: Recent Labs  Lab 06/28/24 0009 06/28/24 0505 06/29/24 0338 06/30/24 0426 07/01/24 0338  WBC 20.1* 17.6* 16.5* 16.4* 10.7*  NEUTROABS 18.2*  --   --   --   --   HGB 15.0 12.3* 11.5* 11.5* 11.1*  HCT 47.2 38.4* 34.9* 34.3* 32.8*  MCV 104.4* 101.9* 100.0 98.6 97.6  PLT 156 109* 106* 80* 87*   Basic Metabolic Panel: Recent Labs  Lab 06/28/24 0505 06/28/24 1252 06/29/24 0338 06/30/24 0426 07/01/24 0338  NA 151* 149* 143 138 137  K 4.8 3.4* 2.9* 3.5 3.0*  CL 116* 116* 110 107 108  CO2 21* 21* 24 22 21*  GLUCOSE 131* 152* 158* 115* 115*  BUN 99* 87* 64* 40* 22  CREATININE 4.27* 3.46* 2.79* 1.74* 1.35*  CALCIUM  7.9* 7.7* 7.4* 7.6* 7.3*  MG  --   --  2.1 2.0 1.7  PHOS  --   --  3.3 1.8* 2.4*   Liver Function Tests: Recent Labs  Lab 06/28/24 0045 06/28/24 0505 06/29/24 0338 06/30/24 0426 07/01/24 0338  AST 65* 102* 44* 48* 44*  ALT 34 54* 37 39 35  ALKPHOS 92 136* 94 99 84  BILITOT 1.2 2.1* 0.8 1.1 1.0  PROT 3.9* 5.8* 4.8* 5.5* 4.9*   ALBUMIN  <1.5* 2.5* 1.8* 2.7* 2.3*   CBG: No results for input(s): GLUCAP in the last 168 hours.  Discharge time spent: 32 minutes.   Signed: Haroldine Redler, MD [facility] 07/01/2024

## 2024-07-02 DIAGNOSIS — G309 Alzheimer's disease, unspecified: Secondary | ICD-10-CM | POA: Diagnosis not present

## 2024-07-02 DIAGNOSIS — N179 Acute kidney failure, unspecified: Secondary | ICD-10-CM | POA: Diagnosis not present

## 2024-07-02 DIAGNOSIS — A419 Sepsis, unspecified organism: Secondary | ICD-10-CM | POA: Diagnosis not present

## 2024-07-02 DIAGNOSIS — A0811 Acute gastroenteropathy due to Norwalk agent: Secondary | ICD-10-CM | POA: Diagnosis not present

## 2024-07-02 MED ORDER — AMOXICILLIN-POT CLAVULANATE 875-125 MG PO TABS: 1.0000 | ORAL_TABLET | Freq: Two times a day (BID) | ORAL | 0 refills | Status: AC

## 2024-07-02 MED ORDER — POTASSIUM & SODIUM PHOSPHATES 280-160-250 MG PO PACK
1.0000 | PACK | Freq: Three times a day (TID) | ORAL | 1 refills | Status: DC
Start: 2024-07-02 — End: 2024-10-01

## 2024-07-02 MED ORDER — MIDODRINE HCL 10 MG PO TABS: 10.0000 mg | ORAL_TABLET | Freq: Three times a day (TID) | ORAL | 1 refills | Status: DC

## 2024-07-02 MED ORDER — ENSURE PLUS HIGH PROTEIN PO LIQD: 237.0000 mL | Freq: Two times a day (BID) | ORAL | 1 refills | Status: AC

## 2024-07-02 MED ORDER — PANTOPRAZOLE SODIUM 40 MG PO TBEC: 40.0000 mg | DELAYED_RELEASE_TABLET | Freq: Two times a day (BID) | ORAL | 1 refills | Status: DC

## 2024-07-02 NOTE — Discharge Summary (Signed)
 Physician Discharge Summary   Patient: Marc [name] MRN: [id number] DOB: Jan 20, 1941  Admit date:     06/28/2024  Discharge date: 07/02/24  Discharge Physician: Elgie Butter   PCP: [name], MD   Recommendations at discharge:  Please follow up with hospice MD as recommended.   Discharge Diagnoses: Principal Problem:   Sepsis (HCC) Active Problems:   Altered mental status   Acute kidney injury superimposed on chronic kidney disease (HCC)   Alzheimer's disease (HCC)   GI bleed   High anion gap metabolic acidosis   PTSD (post-traumatic stress disorder)   Aortic stenosis   History of GI bleed   Norovirus   Pressure injury of skin  Resolved Problems:   * No resolved hospital problems. [facility] Course: Patient is 83 year old male with Alzheimer's dementia, PTSD CKD stage IIIa, HLP, HTN, aortic stenosis, prior GI bleed in 2019 required EGD which showed esophagitis and colonoscopy showed hemorrhoids, presented to ED with bright red blood per rectum.  Family reported ongoing diarrhea for almost a month, poor appetite, dehydration, worsening confusion.  In ED, physical exam did not reveal any active GI bleed per rectum. Family had reported that over the last month he has had chronic diarrhea and over the last year he has had recurrent GI bleeds.  Family requested DNR, no invasive interventions like EGD or colonoscopy however for now continue medical therapy.  Assessment and Plan:   Severe Sepsis (HCC), Proteus bacteremia, likely from UTI Norovirus diarrhea  hypotensive - presented with leukocytosis, AMS, lactic acidosis, AKI, hypotension - CT abdomen pelvis showed irregular bladder wall thickening concern for cystitis otherwise no acute intra-abdominal finding. - Blood cultures positive for Proteus mirabilis, GI pathogen panel positive for norovirus - Antibiotics narrowed to IV Rocephin  transition to oral augmentin  on discharge tomorrow.  - BP remains soft, unable to  give midodrine  due to n.p.o., received IV albumin 's x 3.  Random cortisol level normal 31.0 - Alert and awake, does not follow verbal commands, opens eyes, no meaningful conversation.  Palliative medicine following, overall poor prognosis.  DNR.     Acute metabolic encephalopathy, superimposed on Alzheimer's dementia, PTSD  - likely due to #1 - palliative medicine consulted, likely end stage dementia, overall poor prognosis   - Opens eyes, nods yes to ROS questions, otherwise no verbal conversation.   - Palliative medicine following   Acute kidney injury superimposed CKD stage stage 3b - admitted with Cr 3.27 in the setting chronic diarrhea, GIB, dehydration, poor oral intake, severe sepsis - baseline Cr 1.4-1.79.,  Plateaued at 4.27 - Creatinine improving, 1.74 on IV fluids   Acute respiratory failure with hypoxia - likely due to sepsis, possible fluid overload, was placed on 12L HFNC in ED - overall poor prognosis  - Results 100% on room air   Norovirus diarrhea - C. difficile negative - Continue symptomatic management,   Hypernatremia - likely due to profound dehydration  - Improving, sodium 151 on admission, improved to 138 today    Hypokalemia - replace as needed   Transaminitis - Due to severe sepsis   Hypophosphatemia -K-Phos x 1   GI bleed-likely secondary from hemorrhoid History of GI bleed-secondary to esophagitis and hemorrhoid -Family reported that they have noticed bleeding in the patient's diaper/pants.  Patient has history of internal hemorrhoid underwent EGD and colonoscopy in the past. -Family declined any invasive interventions     Essential hypertension, aortic stenosis - Hypotensive, hold oral antihypertensives, placed on midodrine    Multiple wounds,  POA  Wound care consulted and recommendations given     Consultants: palliative care.  Procedures performed: none.   Disposition: Hospice care Diet recommendation:  Full liquid diet DISCHARGE  MEDICATION:   Follow-up Information     [name], MD. Schedule an appointment as soon as possible for a visit in 1 week(s).   Specialty: Family Medicine Contact information: [address] [place] [address] [phone]                Discharge Exam: Fredricka Weights   06/28/24 0011  Weight: 73 kg   Ill appearing gentleman not in distress.  CVS s1s2 Lungs diminished air entry    Condition at discharge: poor  The results of significant diagnostics from this hospitalization (including imaging, microbiology, ancillary and laboratory) are listed below for reference.   Imaging Studies: CT ABDOMEN PELVIS WO CONTRAST Result Date: 06/28/2024 EXAM: CT ABDOMEN AND PELVIS WITHOUT CONTRAST 06/28/2024 01:45:50 AM TECHNIQUE: CT of the abdomen and pelvis was performed without the administration of intravenous contrast. Multiplanar reformatted images are provided for review. Automated exposure control, iterative reconstruction, and/or weight based adjustment of the mA/kV was utilized to reduce the radiation dose to as low as reasonably achievable. COMPARISON: CT from 08/29/2020. CLINICAL HISTORY: Abdominal pain, acute, nonlocalized. Patient BIB [facility] from home. Family states they were changing patient's brief and noticed dark red blood, first started today. FINDINGS: LOWER CHEST: No acute abnormality. LIVER: The liver is unremarkable. GALLBLADDER AND BILE DUCTS: Gallbladder is unremarkable. No biliary ductal dilatation. SPLEEN: No acute abnormality. PANCREAS: No acute abnormality. ADRENAL GLANDS: No acute abnormality. KIDNEYS, URETERS AND BLADDER: Multiple bilateral renal cystic lesions, some of which are mildly hyperdense. These were compatible with benign cysts on CT from 08/29/2020. The mild hyperdensity may be due to hemorrhagic or proteinaceous transformation. No follow-up recommended. Irregular bladder wall thickening. Correlate with urinalysis for cystitis and  consider cystoscopy to evaluate for mass. Evaluation for active bleeding is not possible on noncontrast exam. No hydronephrosis. No perinephric or periureteral stranding. GI AND BOWEL: Stomach demonstrates no acute abnormality. There is no bowel obstruction. No bowel wall thickening. PERITONEUM AND RETROPERITONEUM: No ascites. No free air. VASCULATURE: Aortic atherosclerotic calcification. LYMPH NODES: No lymphadenopathy. REPRODUCTIVE ORGANS: No acute abnormality. BONES AND SOFT TISSUES: No acute osseous abnormality. No focal soft tissue abnormality. IMPRESSION: 1. Irregular bladder wall thickening. Correlate with urinalysis for cystitis and consider cystoscopy to evaluate for mass. Electronically signed by: Norman Gatlin MD 06/28/2024 01:56 AM EDT RP Workstation: [id number]    Microbiology: Results for orders placed or performed during the hospital encounter of 06/28/24  Blood culture (routine x 2)     Status: Abnormal   Collection Time: 06/28/24  2:06 AM   Specimen: BLOOD  Result Value Ref Range Status   Specimen Description BLOOD RIGHT ANTECUBITAL  Final   Special Requests   Final    BOTTLES DRAWN AEROBIC AND ANAEROBIC Blood Culture results may not be optimal due to an inadequate volume of blood received in culture bottles   Culture  Setup Time   Final    GRAM NEGATIVE RODS IN BOTH AEROBIC AND ANAEROBIC BOTTLES CRITICAL RESULT CALLED TO, READ BACK BY AND VERIFIED WITH: MAYA SHARPER BITONTI [id number] BY JINNY COMMON, [place] Performed at [facility], [address] [place], [place] [address]    Culture PROTEUS MIRABILIS (A)  Final   Report Status 06/30/2024 FINAL  Final   Organism ID, Bacteria PROTEUS MIRABILIS  Final   Organism ID, Bacteria PROTEUS MIRABILIS  Final      Susceptibility   Proteus mirabilis - KIRBY BAUER*    CEFAZOLIN  SENSITIVE Sensitive    Proteus mirabilis - MIC*    AMPICILLIN <=2 SENSITIVE Sensitive     CEFEPIME  <=0.12 SENSITIVE Sensitive     CEFTAZIDIME <=1  SENSITIVE Sensitive     CEFTRIAXONE  <=0.25 SENSITIVE Sensitive     CIPROFLOXACIN <=0.25 SENSITIVE Sensitive     GENTAMICIN <=1 SENSITIVE Sensitive     IMIPENEM 8 INTERMEDIATE Intermediate     TRIMETH/SULFA <=20 SENSITIVE Sensitive     AMPICILLIN/SULBACTAM <=2 SENSITIVE Sensitive     PIP/TAZO <=4 SENSITIVE Sensitive ug/mL    * PROTEUS MIRABILIS    PROTEUS MIRABILIS  Blood Culture ID Panel (Reflexed)     Status: Abnormal   Collection Time: 06/28/24  2:06 AM  Result Value Ref Range Status   Enterococcus faecalis NOT DETECTED NOT DETECTED Final   Enterococcus Faecium NOT DETECTED NOT DETECTED Final   Listeria monocytogenes NOT DETECTED NOT DETECTED Final   Staphylococcus species NOT DETECTED NOT DETECTED Final   Staphylococcus aureus (BCID) NOT DETECTED NOT DETECTED Final   Staphylococcus epidermidis NOT DETECTED NOT DETECTED Final   Staphylococcus lugdunensis NOT DETECTED NOT DETECTED Final   Streptococcus species NOT DETECTED NOT DETECTED Final   Streptococcus agalactiae NOT DETECTED NOT DETECTED Final   Streptococcus pneumoniae NOT DETECTED NOT DETECTED Final   Streptococcus pyogenes NOT DETECTED NOT DETECTED Final   A.calcoaceticus-baumannii NOT DETECTED NOT DETECTED Final   Bacteroides fragilis NOT DETECTED NOT DETECTED Final   Enterobacterales DETECTED (A) NOT DETECTED Final    Comment: Enterobacterales represent a large order of gram negative bacteria, not a single organism.   Enterobacter cloacae complex NOT DETECTED NOT DETECTED Final   Escherichia coli NOT DETECTED NOT DETECTED Final   Klebsiella aerogenes NOT DETECTED NOT DETECTED Final   Klebsiella oxytoca NOT DETECTED NOT DETECTED Final   Klebsiella pneumoniae NOT DETECTED NOT DETECTED Final   Proteus species DETECTED (A) NOT DETECTED Final    Comment: CRITICAL RESULT CALLED TO, READ BACK BY AND VERIFIED WITH: PHARMD MICHAEL BITONTI [id number] BY J RAZZAK, MT    Salmonella species NOT DETECTED NOT DETECTED Final    Serratia marcescens NOT DETECTED NOT DETECTED Final   Haemophilus influenzae NOT DETECTED NOT DETECTED Final   Neisseria meningitidis NOT DETECTED NOT DETECTED Final   Pseudomonas aeruginosa NOT DETECTED NOT DETECTED Final   Stenotrophomonas maltophilia NOT DETECTED NOT DETECTED Final   Candida albicans NOT DETECTED NOT DETECTED Final   Candida auris NOT DETECTED NOT DETECTED Final   Candida glabrata NOT DETECTED NOT DETECTED Final   Candida krusei NOT DETECTED NOT DETECTED Final   Candida parapsilosis NOT DETECTED NOT DETECTED Final   Candida tropicalis NOT DETECTED NOT DETECTED Final   Cryptococcus neoformans/gattii NOT DETECTED NOT DETECTED Final   CTX-M ESBL NOT DETECTED NOT DETECTED Final   Carbapenem resistance IMP NOT DETECTED NOT DETECTED Final   Carbapenem resistance KPC NOT DETECTED NOT DETECTED Final   Carbapenem resistance NDM NOT DETECTED NOT DETECTED Final   Carbapenem resist OXA 48 LIKE NOT DETECTED NOT DETECTED Final   Carbapenem resistance VIM NOT DETECTED NOT DETECTED Final    Comment: Performed at [facility], [address], [place], [place] [address]  Blood culture (routine x 2)     Status: Abnormal   Collection Time: 06/28/24  2:11 AM   Specimen: BLOOD  Result Value Ref Range  Status   Specimen Description BLOOD LEFT ANTECUBITAL  Final   Special Requests   Final    BOTTLES DRAWN AEROBIC AND ANAEROBIC Blood Culture results may not be optimal due to an inadequate volume of blood received in culture bottles   Culture  Setup Time   Final    GRAM NEGATIVE RODS IN BOTH AEROBIC AND ANAEROBIC BOTTLES CRITICAL VALUE NOTED.  VALUE IS CONSISTENT WITH PREVIOUSLY REPORTED AND CALLED VALUE.    Culture (A)  Final    PROTEUS MIRABILIS SUSCEPTIBILITIES PERFORMED ON PREVIOUS CULTURE WITHIN THE LAST 5 DAYS. Performed at [facility], [address] [place], [place] [address]    Report Status 06/30/2024 FINAL  Final  Gastrointestinal Panel by PCR , Stool      Status: Abnormal   Collection Time: 06/28/24  2:26 AM   Specimen: Stool  Result Value Ref Range Status   Campylobacter species NOT DETECTED NOT DETECTED Final   Plesimonas shigelloides NOT DETECTED NOT DETECTED Final   Salmonella species NOT DETECTED NOT DETECTED Final   Yersinia enterocolitica NOT DETECTED NOT DETECTED Final   Vibrio species NOT DETECTED NOT DETECTED Final   Vibrio cholerae NOT DETECTED NOT DETECTED Final   Enteroaggregative E coli (EAEC) NOT DETECTED NOT DETECTED Final   Enteropathogenic E coli (EPEC) NOT DETECTED NOT DETECTED Final   Enterotoxigenic E coli (ETEC) NOT DETECTED NOT DETECTED Final   Shiga like toxin producing E coli (STEC) NOT DETECTED NOT DETECTED Final   Shigella/Enteroinvasive E coli (EIEC) NOT DETECTED NOT DETECTED Final   Cryptosporidium NOT DETECTED NOT DETECTED Final   Cyclospora cayetanensis NOT DETECTED NOT DETECTED Final   Entamoeba histolytica NOT DETECTED NOT DETECTED Final   Giardia lamblia NOT DETECTED NOT DETECTED Final   Adenovirus F40/41 NOT DETECTED NOT DETECTED Final   Astrovirus NOT DETECTED NOT DETECTED Final   Norovirus GI/GII DETECTED (A) NOT DETECTED Final    Comment: RESULT CALLED TO, READ BACK BY AND VERIFIED WITH: ANGIE TINCHER ON 06/28/24 AT 1348 QSD    Rotavirus A NOT DETECTED NOT DETECTED Final   Sapovirus (I, II, IV, and V) NOT DETECTED NOT DETECTED Final    Comment: Performed at [facility], [address] [place], [place] [address]  C Difficile Quick Screen w PCR reflex     Status: None   Collection Time: 06/28/24  2:27 AM   Specimen: Stool  Result Value Ref Range Status   C Diff antigen NEGATIVE NEGATIVE Final   C Diff toxin NEGATIVE NEGATIVE Final   C Diff interpretation No C. difficile detected.  Final    Comment: Performed at [facility], [address], [place], [place] [address]  Urine Culture     Status: Abnormal   Collection Time: 06/28/24  4:59 AM   Specimen: Urine, Clean Catch  Result  Value Ref Range Status   Specimen Description URINE, CLEAN CATCH  Final   Special Requests NONE  Final   Culture (A)  Final    <10,000 COLONIES/mL INSIGNIFICANT GROWTH Performed at [facility], [address], [place], [place] [address]    Report Status 06/29/2024 FINAL  Final  Culture, blood (Routine X 2) w Reflex to ID Panel     Status: None (Preliminary result)   Collection Time: 06/29/24 11:33 AM   Specimen: BLOOD RIGHT HAND  Result Value Ref Range Status   Specimen Description BLOOD RIGHT HAND  Final   Special Requests   Final    BOTTLES  DRAWN AEROBIC AND ANAEROBIC Blood Culture adequate volume   Culture   Final    NO GROWTH 2 DAYS Performed at [facility], [address], [place], [place] [address]    Report Status PENDING  Incomplete  Culture, blood (Routine X 2) w Reflex to ID Panel     Status: None (Preliminary result)   Collection Time: 06/29/24 11:45 AM   Specimen: BLOOD  Result Value Ref Range Status   Specimen Description BLOOD BLOOD RIGHT HAND  Final   Special Requests NONE  Final   Culture   Final    NO GROWTH 2 DAYS Performed at [facility], [address] [place], [place] [address]    Report Status PENDING  Incomplete    Labs: CBC: Recent Labs  Lab 06/28/24 0009 06/28/24 0505 06/29/24 0338 06/30/24 0426 07/01/24 0338  WBC 20.1* 17.6* 16.5* 16.4* 10.7*  NEUTROABS 18.2*  --   --   --   --   HGB 15.0 12.3* 11.5* 11.5* 11.1*  HCT 47.2 38.4* 34.9* 34.3* 32.8*  MCV 104.4* 101.9* 100.0 98.6 97.6  PLT 156 109* 106* 80* 87*   Basic Metabolic Panel: Recent Labs  Lab 06/28/24 0505 06/28/24 1252 06/29/24 0338 06/30/24 0426 07/01/24 0338  NA 151* 149* 143 138 137  K 4.8 3.4* 2.9* 3.5 3.0*  CL 116* 116* 110 107 108  CO2 21* 21* 24 22 21*  GLUCOSE 131* 152* 158* 115* 115*  BUN 99* 87* 64* 40* 22  CREATININE 4.27* 3.46* 2.79* 1.74* 1.35*  CALCIUM  7.9* 7.7* 7.4* 7.6* 7.3*  MG  --   --  2.1 2.0 1.7  PHOS  --   --  3.3 1.8* 2.4*    Liver Function Tests: Recent Labs  Lab 06/28/24 0045 06/28/24 0505 06/29/24 0338 06/30/24 0426 07/01/24 0338  AST 65* 102* 44* 48* 44*  ALT 34 54* 37 39 35  ALKPHOS 92 136* 94 99 84  BILITOT 1.2 2.1* 0.8 1.1 1.0  PROT 3.9* 5.8* 4.8* 5.5* 4.9*  ALBUMIN  <1.5* 2.5* 1.8* 2.7* 2.3*   CBG: No results for input(s): GLUCAP in the last 168 hours.  Discharge time spent: 32 minutes.   Signed: Elgie Butter, MD [facility] 07/02/2024

## 2024-07-02 NOTE — TOC Transition Note (Signed)
 Transition of Care (TOC) - Discharge Note Rayfield Gobble RN, BSN Inpatient Care Management Unit 4E- RN Case Manager See Treatment Team for direct phone #   Patient Details  Name: Marc [name] MRN: [id number] Date of Birth: May 25, 1941  Transition of Care [facility]) CM/SW Contact:  Gobble Rayfield Hurst, RN Phone Number: 07/02/2024, 11:37 AM   Clinical Narrative:    Pt stable for transition home today, Pt will go home with Home Hospice services- [facility] following for Hospice needs. Per Liaison pt does not have DME needs at this time- per son pt already has hospital bed and wheelchair at home.   GOLD DNR on chart.   1135- [facility] called for transport- per dispatch ETA for pickup is about an hour or so. Paperwork placed on chart. Bedside RN updated. Son at bedside updated   Final next level of care: Home w Hospice Care Barriers to Discharge: Barriers Resolved   Patient Goals and CMS Choice Patient states their goals for this hospitalization and ongoing recovery are:: return home w/ hospice   Choice offered to / list presented to : Adult Children      Discharge Placement                 Home w/ Hospice.       Discharge Plan and Services Additional resources added to the After Visit Summary for     Discharge Planning Services: CM Consult Post Acute Care Choice: Hospice          DME Arranged: N/A DME Agency: NA         HH Agency: [facility] Date [facility] Agency Contacted: 06/29/24   Representative spoke with at [facility] Agency: Amy  Social Drivers of Health (SDOH) Interventions SDOH Screenings   Food Insecurity: Patient Unable To Answer (06/29/2024)  Housing: Patient Unable To Answer (06/29/2024)  Transportation Needs: Patient Unable To Answer (06/29/2024)  Utilities: Patient Unable To Answer (06/29/2024)  Social Connections: Patient Unable To Answer (06/29/2024)  Tobacco Use: Low Risk  (06/28/2024)     Readmission Risk Interventions    07/02/2024    11:37 AM  Readmission Risk Prevention Plan  Transportation Screening Complete  Home Care Screening Complete  Medication Review (RN CM) Complete

## 2024-07-02 NOTE — Plan of Care (Signed)

## 2024-07-02 NOTE — Plan of Care (Signed)

## 2024-07-02 NOTE — Progress Notes (Signed)
 Reviewed AVS with family at bedside all questions answered

## 2024-07-02 NOTE — Progress Notes (Signed)
 Pt to be discharged via [facility]. Midline and telemetry removed. Pt dressed in own clothes. Son at bedside. Waiting on ride to arrive.

## 2024-07-02 NOTE — Progress Notes (Signed)
 Discharge order noted for transport with Hospice. AVS printed for transport and place in DC packet. Medical necessity/DNR in the packet.

## 2024-07-04 LAB — CULTURE, BLOOD (ROUTINE X 2)
Culture: NO GROWTH
Culture: NO GROWTH
Special Requests: ADEQUATE

## 2024-08-26 DEATH — deceased
